# Patient Record
Sex: Female | Born: 1980 | Race: Black or African American | Hispanic: No | Marital: Single | Smoking: Current every day smoker
Health system: Southern US, Community
[De-identification: ages and names within clinical notes are randomized; demographics above are authoritative.]

## PROBLEM LIST (undated history)

## (undated) DIAGNOSIS — I10 Essential (primary) hypertension: Secondary | ICD-10-CM

## (undated) DIAGNOSIS — J45909 Unspecified asthma, uncomplicated: Secondary | ICD-10-CM

## (undated) DIAGNOSIS — D649 Anemia, unspecified: Secondary | ICD-10-CM

## (undated) DIAGNOSIS — G4730 Sleep apnea, unspecified: Secondary | ICD-10-CM

---

## 2011-06-23 ENCOUNTER — Emergency Department (HOSPITAL_COMMUNITY)
Admission: EM | Admit: 2011-06-23 | Discharge: 2011-06-23 | Disposition: A | Discharge status: Home / Self Care | Payer: Self-pay | Attending: Emergency Medicine | Admitting: Emergency Medicine

## 2011-06-23 DIAGNOSIS — Z0000 Encounter for general adult medical examination without abnormal findings: Secondary | ICD-10-CM | POA: Insufficient documentation

## 2011-06-23 DIAGNOSIS — R110 Nausea: Secondary | ICD-10-CM | POA: Insufficient documentation

## 2015-10-30 ENCOUNTER — Emergency Department (HOSPITAL_BASED_OUTPATIENT_CLINIC_OR_DEPARTMENT_OTHER)
Admission: EM | Admit: 2015-10-30 | Discharge: 2015-10-30 | Disposition: A | Discharge status: Home / Self Care | Payer: Medicaid Other | Attending: Emergency Medicine | Admitting: Emergency Medicine

## 2015-10-30 ENCOUNTER — Encounter (HOSPITAL_BASED_OUTPATIENT_CLINIC_OR_DEPARTMENT_OTHER): Payer: Self-pay | Admitting: *Deleted

## 2015-10-30 DIAGNOSIS — N390 Urinary tract infection, site not specified: Secondary | ICD-10-CM

## 2015-10-30 DIAGNOSIS — Z880 Allergy status to penicillin: Secondary | ICD-10-CM | POA: Insufficient documentation

## 2015-10-30 DIAGNOSIS — R319 Hematuria, unspecified: Secondary | ICD-10-CM | POA: Diagnosis present

## 2015-10-30 DIAGNOSIS — I10 Essential (primary) hypertension: Secondary | ICD-10-CM | POA: Diagnosis not present

## 2015-10-30 DIAGNOSIS — R030 Elevated blood-pressure reading, without diagnosis of hypertension: Secondary | ICD-10-CM | POA: Diagnosis not present

## 2015-10-30 DIAGNOSIS — Z3202 Encounter for pregnancy test, result negative: Secondary | ICD-10-CM | POA: Diagnosis not present

## 2015-10-30 DIAGNOSIS — F17210 Nicotine dependence, cigarettes, uncomplicated: Secondary | ICD-10-CM | POA: Insufficient documentation

## 2015-10-30 HISTORY — DX: Essential (primary) hypertension: I10

## 2015-10-30 LAB — URINE MICROSCOPIC-ADD ON

## 2015-10-30 LAB — URINALYSIS, ROUTINE W REFLEX MICROSCOPIC
Glucose, UA: NEGATIVE mg/dL
Ketones, ur: 15 mg/dL — AB
NITRITE: POSITIVE — AB
Protein, ur: 100 mg/dL — AB
SPECIFIC GRAVITY, URINE: 1.027 (ref 1.005–1.030)
pH: 6 (ref 5.0–8.0)

## 2015-10-30 LAB — PREGNANCY, URINE: PREG TEST UR: NEGATIVE

## 2015-10-30 MED ORDER — PHENAZOPYRIDINE HCL 100 MG PO TABS
100.0000 mg | ORAL_TABLET | Freq: Once | ORAL | Status: AC
  Administered 2015-10-30: 100 mg via ORAL
  Filled 2015-10-30: qty 1

## 2015-10-30 MED ORDER — CEPHALEXIN 500 MG PO CAPS: 500.0000 mg | ORAL_CAPSULE | Freq: Two times a day (BID) | ORAL | Status: DC

## 2015-10-30 MED ORDER — CEPHALEXIN 250 MG PO CAPS
500.0000 mg | ORAL_CAPSULE | Freq: Once | ORAL | Status: AC
  Administered 2015-10-30: 500 mg via ORAL
  Filled 2015-10-30: qty 2

## 2015-10-30 MED ORDER — PHENAZOPYRIDINE HCL 200 MG PO TABS: 200.0000 mg | ORAL_TABLET | Freq: Three times a day (TID) | ORAL | Status: DC

## 2015-10-30 MED FILL — CEPHALEXIN 500 MG CAPSULE: 500 | 10 days supply | Qty: 20 | Fill #0

## 2015-10-30 NOTE — Discharge Instructions (Signed)
Please follow with your primary care doctor in the next 5 days for high blood pressure evaluation. If you do not have a primary care doctor, present to urgent care. Reduce salt intake. Seek emergency medical care for unilateral weakness, slurring, change in vision, or chest pain and shortness of breath.  Take your antibiotics as directed and to completion. You should never have any leftover antibiotics! Push fluids and stay well hydrated.   Any antibiotic use can reduce the efficacy of hormonal birth control. Please use back up method of contraception.   Do not hesitate to return to the emergency room for any new, worsening or concerning symptoms.  Please obtain primary care using resource guide below. Let them know that you were seen in the emergency room and that they will need to obtain records for further outpatient management.   Urinary Tract Infection Urinary tract infections (UTIs) can develop anywhere along your urinary tract. Your urinary tract is your body's drainage system for removing wastes and extra water. Your urinary tract includes two kidneys, two ureters, a bladder, and a urethra. Your kidneys are a pair of bean-shaped organs. Each kidney is about the size of your fist. They are located below your ribs, one on each side of your spine. CAUSES Infections are caused by microbes, which are microscopic organisms, including fungi, viruses, and bacteria. These organisms are so small that they can only be seen through a microscope. Bacteria are the microbes that most commonly cause UTIs. SYMPTOMS  Symptoms of UTIs may vary by age and gender of the patient and by the location of the infection. Symptoms in young women typically include a frequent and intense urge to urinate and a painful, burning feeling in the bladder or urethra during urination. Older women and men are more likely to be tired, shaky, and weak and have muscle aches and abdominal pain. A fever may mean the infection is in  your kidneys. Other symptoms of a kidney infection include pain in your back or sides below the ribs, nausea, and vomiting. DIAGNOSIS To diagnose a UTI, your caregiver will ask you about your symptoms. Your caregiver will also ask you to provide a urine sample. The urine sample will be tested for bacteria and white blood cells. White blood cells are made by your body to help fight infection. TREATMENT  Typically, UTIs can be treated with medication. Because most UTIs are caused by a bacterial infection, they usually can be treated with the use of antibiotics. The choice of antibiotic and length of treatment depend on your symptoms and the type of bacteria causing your infection. HOME CARE INSTRUCTIONS  If you were prescribed antibiotics, take them exactly as your caregiver instructs you. Finish the medication even if you feel better after you have only taken some of the medication.  Drink enough water and fluids to keep your urine clear or pale yellow.  Avoid caffeine, tea, and carbonated beverages. They tend to irritate your bladder.  Empty your bladder often. Avoid holding urine for long periods of time.  Empty your bladder before and after sexual intercourse.  After a bowel movement, women should cleanse from front to back. Use each tissue only once. SEEK MEDICAL CARE IF:   You have back pain.  You develop a fever.  Your symptoms do not begin to resolve within 3 days. SEEK IMMEDIATE MEDICAL CARE IF:   You have severe back pain or lower abdominal pain.  You develop chills.  You have nausea or vomiting.  You  have continued burning or discomfort with urination. MAKE SURE YOU:   Understand these instructions.  Will watch your condition.  Will get help right away if you are not doing well or get worse.   This information is not intended to replace advice given to you by your health care provider. Make sure you discuss any questions you have with your health care provider.     Document Released: 07/24/2005 Document Revised: 07/05/2015 Document Reviewed: 11/22/2011 Elsevier Interactive Patient Education 2016 [employer].   Emergency Department Resource Guide 1) Find a Doctor and Pay Out of Pocket Although you won't have to find out who is covered by your insurance plan, it is a good idea to ask around and get recommendations. You will then need to call the office and see if the doctor you have chosen will accept you as a new patient and what types of options they offer for patients who are self-pay. Some doctors offer discounts or will set up payment plans for their patients who do not have insurance, but you will need to ask so you aren't surprised when you get to your appointment.  2) Contact Your Local Health Department Not all health departments have doctors that can see patients for sick visits, but many do, so it is worth a call to see if yours does. If you don't know where your local health department is, you can check in your phone book. The CDC also has a tool to help you locate your state's health department, and many state websites also have listings of all of their local health departments.  3) Find a Walk-in Clinic If your illness is not likely to be very severe or complicated, you may want to try a walk in clinic. These are popping up all over the country in pharmacies, drugstores, and shopping centers. They're usually staffed by nurse practitioners or physician assistants that have been trained to treat common illnesses and complaints. They're usually fairly quick and inexpensive. However, if you have serious medical issues or chronic medical problems, these are probably not your best option.  No Primary Care Doctor: - Call Health Connect at  [phone] - they can help you locate a primary care doctor that  accepts your insurance, provides certain services, etc. - Physician Referral Service- [phone]  Chronic Pain Problems: Organization          Address  Phone   Notes  [facility]  [phone] Patients need to be referred by their primary care doctor.   Medication Assistance: Organization         Address  Phone   Notes  [facility] Medication [facility] [address] [place] [place], [place] [address] [phone] --Must be a resident of [facility] -- Must have NO insurance coverage whatsoever (no Medicaid/ Medicare, etc.) -- The pt. MUST have a primary care doctor that directs their care regularly and follows them in the community   MedAssist  [phone]   [employer]  [phone]    Agencies that provide inexpensive medical care: Organization         Address  Phone   Notes  [facility]  [phone]   [facility]    [phone]   [facility] [address] [place], [place] [address] [phone]   [facility] [address] [place] [place] [phone]   Planned Parenthood    [phone]  [facility]    [phone]   [facility]  [address], [place] Phone:  [phone], Fax:  [phone]) [phone] Hours of Operation:  9 am - 6 pm, M-F.  Also accepts Medicaid/Medicare and self-pay.  [facility]  [address], [place] Phone: [phone], Fax: [phone]) [phone]. Hours of Operation:  8:30 am - 5:30 pm, M-F.  Also accepts Medicaid and self-pay.  [facility] [place] [address], [place] Phone: [phone]) [phone]   [facility] [address], [place], [place] [phone], Ext. [phone] Mondays & Thursdays: 7-9 AM.  First 15 patients are seen on a first come, first serve basis.    Medicaid-accepting [facility] Providers:  Organization         Address  Phone   Notes  [facility] [address], [place] [phone]) [phone] Also accepts self-pay patients.  [facility] [address], [place]  [phone]) [phone]   [facility] [address], [place] [phone]) [phone]   [facility] [address] [place] [address], [place] [phone]) [phone]   Renaye RakersVeita Bland [address], [place]   [phone]) [phone] Only accepts [place] Access [place] patients after they have their name applied to their card.   Self-Pay (no insurance) in [facility]:  Organization         Address  Phone   Notes  Sickle Cell Patients, [facility] [address], [place] [phone]) [phone]   [facility] [address], [place] [phone]) [phone]   [facility]  [address], [place] [phone](336) [phone]   [facility]/Dr. Osei-Bonsu  [address] [place] [address], [place] or [phone], [place] [phone]) [phone] Phone number for both [place] and [place] locations is the same.  [facility] [address], [place] [phone]) [phone]   [facility] [place] [address] [place] [address], [place] or [address] [phone]) [phone]   [facility] [address], [place] [phone](336) [phone], phone; [phone](336) [phone], fax Sees patients 1st and 3rd Saturday of every month.  Must not qualify for public or private insurance (i.e. Medicaid, Medicare, [place] Health Choice, Veterans' Benefits)  Household income should be no more than 200% of the poverty level The clinic cannot treat you if you are pregnant or think you are pregnant  Sexually transmitted diseases are not treated at the clinic.    Dental Care: Organization         Address  Phone  Notes  [facility] of [facility] [address], [place] [phone]) [phone] Accepts children up to age 34 who are enrolled in [place] or [place] Health Choice; pregnant women with a Medicaid card; and  children who have applied for Medicaid or [place] Health Choice, but were declined, whose parents can pay a reduced fee at time of service.  [facility] of [facility] [place]  [address], [place] [phone]) [phone] Accepts children up to age 34 who are enrolled in [place] or [place] Health Choice; pregnant women with a Medicaid card; and children who have applied for Medicaid or [place] Health Choice, but were declined, whose parents can pay a reduced fee at time  of service.  [facility]  [address], [place] [phone]) [phone] Patients are seen by appointment only. Walk-ins are not accepted. [facility] will see patients 34 years of age and older. Monday - Tuesday (8am-5pm) Most Wednesdays (8:30-5pm) $30 per visit, cash only  [facility]  [address], [facility] [phone] Patients are seen by appointment only. Walk-ins are not accepted. [facility] will see patients 34 years of age and older. One Wednesday Evening (Monthly: Volunteer Based).  $30 per visit, cash only  [employer] [facility] [facility]  [phone] for adults; Children under age 234, call Graduate Pediatric Dentistry at [phone]. Children aged 464-14, please call [phone] to request a pediatric application.  Dental services are provided in all areas of dental care including fillings, crowns and bridges, complete and partial dentures, implants, gum treatment, root canals, and extractions. Preventive care is also provided. Treatment is provided to both adults and children. Patients are selected via a lottery and there is often a waiting list.   [facility] [address], [place]  [phone] [email or web address]   [facility] [address], [place], [place] [phone], Ext. [phone] Second and Fourth Thursday of each month, opens at 6:30 AM; Clinic ends at 9 AM.  Patients are seen on a first-come first-served  basis, and a limited number are seen during each clinic.   [facility]  [address], [place], [place] [phone]) [phone]   Eligibility Requirements You must have lived in [place], [place], or [place] counties for at least the last three months.   You cannot be eligible for state or federal sponsored National Cityhealthcare insurance, including CIGNAVeterans Administration, [place]icaid, or [employer]Medicare.   You generally cannot be eligible for healthcare insurance through your employer.    How to apply: Eligibility screenings are held every Tuesday and Wednesday afternoon from 1:00 pm until 4:00 pm. You do not need an appointment for the interview!  [facility] [address], [place], [place] [phone]   [facility]  [phone]   [facility]  [phone]   [facility]  [phone]    Behavioral Health Resources in the Community: Intensive Outpatient Programs Organization         Address  Phone  Notes  [facility] [address], [place], [place] [phone]   [facility] [address], [place], [place] [phone]   [facility] [address], [place], [place]  [phone]   [facility] [address],  [place], [place] [phone] or [phone]   Substance Abuse Resources Organization         Address  Phone  Notes  [facility]  [phone]   [facility]  [phone]   [facility]  [phone]   [facility]  [phone]   Residential & Outpatient Substance Abuse Program  [phone]   Psychological Services Organization         Address  Phone  Notes  [facility]  [phone]- [phone]   [facility]  [phone]- [phone]   [facility] [address], [place] [phone] or [phone]    Mobile Crisis Teams Organization          Address  Phone  Notes  [facility], Mobile Crisis Care Unit  [phone]   [facility]ive Psychotherapeutic  Services  [address] [place], [place] [phone]   [facility] [address] [place] [phone]    Self-Help/Support Groups Organization         Address  Phone             Notes  [facility]ental Health Assoc[facility] of [place] - variety of support groups  [phone]- [phone] Call for more information  Narcotics Anonymous (NA), [facility] [address], [employer] [place]  2 meetings at this location   Statistician         Address  Phone  Notes  [facility] [address],    [place]  [phone]   [facility]  [address] [place] [address], [place], [place] [phone]   [facility] [address] [place], [place] [phone] Admissions: 8am-3pm M-F  [facility] [address]    [place], [place] [phone]   [facility] [address] [place], [place], [place] [phone]   [facility] [address],  [place], [place] [phone]   [facility] - Intensive Outpatient [address], [place], [place] [phone]   [facility] (Addiction [facility] [address]  [place], [place] [phone] or [phone]   [facility] (RTS) [address], [place], [place] [phone] Accepts Medicaid  [facility] [place] [address],  [place] [phone] Substance Abuse/Addiction Treatment   [facility] Organization         Address  Phone  Notes  [facility]  [phone]   Angie Fava, PhD [address] [place], [place]   [phone] or [phone]   [facility]   [address] [place], [place] [phone]   [facility] [address] [place], [place] [phone] Insurance/Medicaid/sponsorship  through [facility] and Families [address]                                    [place], [place] [phone] Therapy/tele-psych/case  [facility] [address], [place] [phone]    Dr. Lolly Mustache  [phone]   [facility]  [employer] [facility] Dept. 1) [address], [place] 2) [address] [place] 3)  [address], [place] [phone]  [phone]   [facility] Child Abuse Hotline 903-213-4031 or [phone] (After Hours)

## 2015-10-30 NOTE — ED Provider Notes (Signed)
CSN: [id number]     Arrival date & time 10/30/15  1206 History   First MD Initiated Contact with Patient 10/30/15 1216     Chief Complaint  Patient presents with  . Hematuria     (Consider location/radiation/quality/duration/timing/severity/associated sxs/prior Treatment) HPI   Blood pressure 168/92, pulse 98, temperature 98.1 F (36.7 C), temperature source Oral, resp. rate 20, height 5\' 11"  (1.803 m), weight 179.398 kg, last menstrual period 10/09/2015, SpO2 95 %.  Morgan Bryant is a 35 y.o. female complaining of dysuria, hematuria, urinary sediment onset this morning at 4 AM. Patient also has urinary frequency and sensation of incomplete voiding. She denies abnormal vaginal discharge, fever, chills, nausea, vomiting, flank pain. Patient states that she holds her urine.   Past Medical History  Diagnosis Date  . Hypertension    Past Surgical History  Procedure Laterality Date  . Cesarean section     No family history on file. Social History  Substance Use Topics  . Smoking status: Current Every Day Smoker -- 0.50 packs/day    Types: Cigarettes  . Smokeless tobacco: None  . Alcohol Use: No   OB History    No data available     Review of Systems  10 systems reviewed and found to be negative, except as noted in the HPI.   Allergies  Penicillins  Home Medications   Prior to Admission medications   Medication Sig Start Date End Date Taking? Authorizing Provider  cephALEXin (KEFLEX) 500 MG capsule Take 1 capsule (500 mg total) by mouth 2 (two) times daily. 10/30/15   Geisha Abernathy, PA-C  phenazopyridine (PYRIDIUM) 200 MG tablet Take 1 tablet (200 mg total) by mouth 3 (three) times daily. 10/30/15   Rilynne Lonsway, PA-C   BP 168/92 mmHg  Pulse 98  Temp(Src) 98.1 F (36.7 C) (Oral)  Resp 20  Ht 5\' 11"  (1.803 m)  Wt 179.398 kg  BMI 55.19 kg/m2  SpO2 95%  LMP 10/09/2015 Physical Exam  Constitutional: She is oriented to person, place, and time. She appears  well-developed and well-nourished. No distress.  HENT:  Head: Normocephalic.  Mouth/Throat: Oropharynx is clear and moist.  Eyes: Conjunctivae and EOM are normal.  Neck: Normal range of motion.  Cardiovascular: Normal rate.   Pulmonary/Chest: Effort normal. No stridor.  Abdominal: Soft. Bowel sounds are normal. She exhibits no distension and no mass. There is tenderness. There is no rebound and no guarding.  Mild suprapubic tenderness to palpation with no guarding or rebound  Genitourinary:  No CVA tenderness to percussion bilaterally  Musculoskeletal: Normal range of motion.  Neurological: She is alert and oriented to person, place, and time.  Psychiatric: She has a normal mood and affect.  Nursing note and vitals reviewed.   ED Course  Procedures (including critical care time) Labs Review Labs Reviewed  URINALYSIS, ROUTINE W REFLEX MICROSCOPIC (NOT AT [facility]) - Abnormal; Notable for the following:    Color, Urine AMBER (*)    APPearance CLOUDY (*)    Hgb urine dipstick LARGE (*)    Bilirubin Urine SMALL (*)    Ketones, ur 15 (*)    Protein, ur 100 (*)    Nitrite POSITIVE (*)    Leukocytes, UA SMALL (*)    All other components within normal limits  URINE MICROSCOPIC-ADD ON - Abnormal; Notable for the following:    Squamous Epithelial / LPF 0-5 (*)    Bacteria, UA MANY (*)    All other components within normal limits  URINE CULTURE  PREGNANCY, URINE    Imaging Review No results found. I have personally reviewed and evaluated these images and lab results as part of my medical decision-making.   EKG Interpretation None      MDM   Final diagnoses:  UTI (lower urinary tract infection)  Elevated blood pressure reading    Filed Vitals:   10/30/15 1214  BP: 168/92  Pulse: 98  Temp: 98.1 F (36.7 C)  TempSrc: Oral  Resp: 20  Height: 5\' 11"  (1.803 m)  Weight: 179.398 kg  SpO2: 95%    Medications  phenazopyridine (PYRIDIUM) tablet 100 mg (not administered)   cephALEXin (KEFLEX) capsule 500 mg (not administered)    Morgan Bryant is 35 y.o. female presenting with dysuria, hematuria, urinary frequency, sensation of incomplete void onset this a.m. Patient afebrile, no CVA tenderness to percussion. Tolerating by mouth's. Her blood pressure is elevated, has history of hypertension, advised patient she will need to follow with PCP for a recheck. Counseled on smoking cessation.  UA consistent with infection, culture ordered and pending. Patient will be started on Keflex and Pyridium.  Evaluation does not show pathology that would require ongoing emergent intervention or inpatient treatment. Pt is hemodynamically stable and mentating appropriately. Discussed findings and plan with patient/guardian, who agrees with care plan. All questions answered. Return precautions discussed and outpatient follow up given.   New Prescriptions   CEPHALEXIN (KEFLEX) 500 MG CAPSULE    Take 1 capsule (500 mg total) by mouth 2 (two) times daily.   PHENAZOPYRIDINE (PYRIDIUM) 200 MG TABLET    Take 1 tablet (200 mg total) by mouth 3 (three) times daily.         Wynetta Emery, PA-C 10/30/15 1245  Rolan Bucco, MD 10/30/15 1451

## 2015-10-30 NOTE — ED Notes (Signed)
Hematuria and dysuria woke her at 4am.

## 2015-11-01 LAB — URINE CULTURE: Culture: >=100000

## 2015-11-02 ENCOUNTER — Telehealth (HOSPITAL_COMMUNITY): Payer: Self-pay

## 2015-11-02 NOTE — Telephone Encounter (Signed)
Post ED Visit - Positive Culture Follow-up  Culture report reviewed by antimicrobial stewardship pharmacist:  []  Enzo BiNathan Batchelder, Pharm.D. []  Celedonio MiyamotoJeremy Frens, Pharm.D., BCPS []  Garvin FilaMike Maccia, Pharm.D. []  Georgina PillionElizabeth Martin, Pharm.D., BCPS []  CampobelloMinh Pham, [address]Pharm[address]D., BCPS, AAHIVP []  Estella HuskMichelle Turner, Pharm.D., BCPS, AAHIVP [x]  Tennis Mustassie Stewart, Pharm.D. []  Sherle Poeob Vincent, [address]Pharm[address]  Positive urine culture, >/= 100,000 colonies -> E Coli Treated with Cephalexin, organism sensitive to the same and no further patient follow-up is required at this time.  Arvid RightClark, Avonna Iribe Dorn 11/02/2015, 12:36 PM

## 2016-04-08 ENCOUNTER — Emergency Department (HOSPITAL_BASED_OUTPATIENT_CLINIC_OR_DEPARTMENT_OTHER)
Admission: EM | Admit: 2016-04-08 | Discharge: 2016-04-08 | Disposition: A | Discharge status: Home / Self Care | Payer: Medicaid Other | Attending: Emergency Medicine | Admitting: Emergency Medicine

## 2016-04-08 ENCOUNTER — Encounter (HOSPITAL_BASED_OUTPATIENT_CLINIC_OR_DEPARTMENT_OTHER): Payer: Self-pay | Admitting: Emergency Medicine

## 2016-04-08 ENCOUNTER — Emergency Department (HOSPITAL_BASED_OUTPATIENT_CLINIC_OR_DEPARTMENT_OTHER): Payer: Medicaid Other

## 2016-04-08 DIAGNOSIS — Z7951 Long term (current) use of inhaled steroids: Secondary | ICD-10-CM | POA: Diagnosis not present

## 2016-04-08 DIAGNOSIS — J45901 Unspecified asthma with (acute) exacerbation: Secondary | ICD-10-CM | POA: Diagnosis not present

## 2016-04-08 DIAGNOSIS — R509 Fever, unspecified: Secondary | ICD-10-CM | POA: Insufficient documentation

## 2016-04-08 DIAGNOSIS — R55 Syncope and collapse: Secondary | ICD-10-CM | POA: Diagnosis not present

## 2016-04-08 DIAGNOSIS — F17210 Nicotine dependence, cigarettes, uncomplicated: Secondary | ICD-10-CM | POA: Insufficient documentation

## 2016-04-08 DIAGNOSIS — I10 Essential (primary) hypertension: Secondary | ICD-10-CM | POA: Insufficient documentation

## 2016-04-08 DIAGNOSIS — M549 Dorsalgia, unspecified: Secondary | ICD-10-CM | POA: Insufficient documentation

## 2016-04-08 DIAGNOSIS — R0602 Shortness of breath: Secondary | ICD-10-CM | POA: Diagnosis present

## 2016-04-08 DIAGNOSIS — Z79899 Other long term (current) drug therapy: Secondary | ICD-10-CM | POA: Insufficient documentation

## 2016-04-08 HISTORY — DX: Unspecified asthma, uncomplicated: J45.909

## 2016-04-08 MED ORDER — IPRATROPIUM-ALBUTEROL 0.5-2.5 (3) MG/3ML IN SOLN
3.0000 mL | Freq: Once | RESPIRATORY_TRACT | Status: AC
  Administered 2016-04-08: 3 mL via RESPIRATORY_TRACT

## 2016-04-08 MED ORDER — ALBUTEROL SULFATE (2.5 MG/3ML) 0.083% IN NEBU
INHALATION_SOLUTION | RESPIRATORY_TRACT | Status: AC
  Administered 2016-04-08: 2.5 mg via RESPIRATORY_TRACT
  Filled 2016-04-08: qty 3

## 2016-04-08 MED ORDER — IPRATROPIUM-ALBUTEROL 0.5-2.5 (3) MG/3ML IN SOLN
RESPIRATORY_TRACT | Status: AC
  Administered 2016-04-08: 3 mL via RESPIRATORY_TRACT
  Filled 2016-04-08: qty 3

## 2016-04-08 MED ORDER — ALBUTEROL SULFATE (2.5 MG/3ML) 0.083% IN NEBU
2.5000 mg | INHALATION_SOLUTION | Freq: Once | RESPIRATORY_TRACT | Status: AC
  Administered 2016-04-08: 2.5 mg via RESPIRATORY_TRACT

## 2016-04-08 MED ORDER — ALBUTEROL SULFATE (2.5 MG/3ML) 0.083% IN NEBU: 2.5000 mg | INHALATION_SOLUTION | Freq: Four times a day (QID) | RESPIRATORY_TRACT | Status: DC | PRN

## 2016-04-08 MED ORDER — PREDNISONE 10 MG PO TABS: 40.0000 mg | ORAL_TABLET | Freq: Every day | ORAL | Status: AC

## 2016-04-08 MED FILL — ALBUTEROL 0.083% INHAL SOLN: (2.5 MG/3ML | 7 days supply | Qty: 75 | Fill #0

## 2016-04-08 NOTE — ED Notes (Signed)
Patient transported to X-ray 

## 2016-04-08 NOTE — ED Notes (Signed)
Nurse first-pt c/o sob r/t to asthma- O2 sat 99 HR 108 RR 20-pt NAD-states she left [facility] ED just prior to coming here

## 2016-04-08 NOTE — ED Notes (Signed)
Patient comes in because the patient had "asthma attack" The patient reports that EMS gave her 2 treatments and she is still SOB

## 2016-04-08 NOTE — ED Provider Notes (Signed)
CSN: [id number]     Arrival date & time 04/08/16  1450 History  By signing my name below, I, Soijett Blue, attest that this documentation has been prepared under the direction and in the presence of Alvira MondayErin Mariel Gaudin, MD. Electronically Signed: Soijett Blue, ED Scribe. 04/08/2016. 3:19 PM.   Chief Complaint  Patient presents with  . Shortness of Breath      The history is provided by the patient. No language interpreter was used.    Morgan SchmidKenyatta Strider is a 35 y.o. female with a medical hx of asthma and HTN who presents to the Emergency Department complaining of SOB onset last night worsening this morning. Pt typically uses her albuterol inhaler and neb treatment for exacerbation of her asthma. Pt states that she has been admitted to the hospital for her asthma and placed in the ICU unit due to her asthma. Pt was at [facility] but LWBS following triage, pt was transported to [facility] by EMS and given 2 breathing treatments en route along with steroids. Pt is having associated symptoms of productive cough x yellow-white sputum, wheezing, feeling hot and cold, rhinorrhea, sore throat, mild CP due to asthma, and mid back pain worsened with breathing and palpation. Denies sick contacts or seasonal allergies. She notes that she has tried albuterol inhaler with no relief of her symptoms. She denies abdominal pain, leg pain/swelling, and any other symptoms. Pt denies recent travel, immobilization, surgery, estrogen use, birth control use, or PMHx of blood clots. Pt has had a tubal ligation.  Reports this asthma exacerbation is not as severe as others.   Past Medical History  Diagnosis Date  . Hypertension   . Asthma    Past Surgical History  Procedure Laterality Date  . Cesarean section     History reviewed. No pertinent family history. Social History  Substance Use Topics  . Smoking status: Current Every Day Smoker -- 0.50 packs/day    Types: Cigarettes  . Smokeless tobacco: None   . Alcohol Use: No   OB History    No data available     Review of Systems  Constitutional: Positive for fever (subjective feeling "hot and cold") and chills (feeling hot and cold).  HENT: Positive for rhinorrhea and sore throat.   Eyes: Negative for visual disturbance.  Respiratory: Positive for cough (productive), shortness of breath and wheezing.   Cardiovascular: Positive for chest pain (due to asthma ). Negative for leg swelling.  Gastrointestinal: Negative for nausea, vomiting and abdominal pain.  Genitourinary: Negative for difficulty urinating.  Musculoskeletal: Positive for back pain. Negative for arthralgias and neck pain.  Skin: Negative for rash.  Neurological: Positive for syncope. Negative for headaches.      Allergies  Penicillins  Home Medications   Prior to Admission medications   Medication Sig Start Date End Date Taking? Authorizing Provider  albuterol (PROVENTIL HFA;VENTOLIN HFA) 108 (90 Base) MCG/ACT inhaler Inhale 2 puffs into the lungs every 6 (six) hours as needed for wheezing or shortness of breath.   Yes Historical Provider, MD  lisinopril (PRINIVIL,ZESTRIL) 20 MG tablet Take 20 mg by mouth daily.   Yes Historical Provider, MD  albuterol (PROVENTIL) (2.5 MG/3ML) 0.083% nebulizer solution Take 3 mLs (2.5 mg total) by nebulization every 6 (six) hours as needed for wheezing or shortness of breath. 04/08/16   Alvira MondayErin Jasmon Graffam, MD  cephALEXin (KEFLEX) 500 MG capsule Take 1 capsule (500 mg total) by mouth 2 (two) times daily. 10/30/15   Wynetta EmeryNicole Pisciotta, PA-C  phenazopyridine (PYRIDIUM) 200 MG tablet Take 1 tablet (200 mg total) by mouth 3 (three) times daily. 10/30/15   Nicole Pisciotta, PA-C  predniSONE (DELTASONE) 10 MG tablet Take 4 tablets (40 mg total) by mouth daily. 04/08/16 04/12/16  Alvira Monday, MD   BP 156/92 mmHg  Pulse 101  Temp(Src) 98.5 F (36.9 C) (Oral)  Resp 18  Ht  (1.803 m)  Wt 375 lb (170.099 kg)  BMI 52.33 kg/m2  SpO2 100%  LMP  03/28/2016 Physical Exam  Constitutional: She is oriented to person, place, and time. She appears well-developed and well-nourished. No distress.  HENT:  Head: Normocephalic and atraumatic.  Eyes: EOM are normal.  Neck: Neck supple.  Cardiovascular: Normal rate, regular rhythm, normal heart sounds and intact distal pulses.  Exam reveals no gallop and no friction rub.   No murmur heard. Pulmonary/Chest: Effort normal. No respiratory distress. She has wheezes. She has no rales. She exhibits tenderness (back tenderness on right).  End expiratory wheezing  Abdominal: Soft. She exhibits no distension. There is no guarding.  Musculoskeletal: Normal range of motion.  Neurological: She is alert and oriented to person, place, and time.  Skin: Skin is warm and dry.  Psychiatric: She has a normal mood and affect. Her behavior is normal.  Nursing note and vitals reviewed.   ED Course  Procedures (including critical care time) DIAGNOSTIC STUDIES: Oxygen Saturation is 97% on RA, nl by my interpretation.    COORDINATION OF CARE: 3:17 PM Discussed treatment plan with pt at bedside which includes breathing treatment, EKG, and CXR and pt agreed to plan.    Labs Review Labs Reviewed - No data to display  Imaging Review Dg Chest 2 View  04/08/2016  CLINICAL DATA:  Cough and shortness of breath EXAM: CHEST  2 VIEW COMPARISON:  None. FINDINGS: Lungs are clear. Heart size and pulmonary vascularity are normal. No adenopathy. There is slight upper thoracic levoscoliosis. IMPRESSION: No edema or consolidation. Electronically Signed   By: Bretta Bang III M.D.   On: 04/08/2016 16:05   I have personally reviewed and evaluated these images as part of my medical decision-making.   EKG Interpretation   Date/Time:  Monday April 08 2016 15:38:09 EDT Ventricular Rate:  111 PR Interval:  150 QRS Duration: 88 QT Interval:  338 QTC Calculation: 459 R Axis:   55 Text Interpretation:  Sinus tachycardia  Otherwise normal ECG No previous  ECGs available Confirmed by [facility] MD, Genevive Printup ([id number]) on 04/08/2016  3:44:29 PM      MDM   Final diagnoses:  Asthma exacerbation   35 year old female with a history of hypertension and asthma presents with concern of dyspnea.  Patient with a history of asthma, reports of these symptoms are the same as prior asthma exacerbations. Reports some chest discomfort which she has with her asthma exacerbations, and have low suspicion for ACS. Have low suspicion for pulmonary embolus, given patient with normal oxygen saturation, no other risk factors, and wheezing on exam consistent with asthma exacerbations. Pt with back tenderness likely musculoskeletal. X-ray was obtained to evaluate for signs of pneumonia and showed no acute abnormalities. EKG shows sinus tachycardia, likely secondary to albuterol. Patient had received IV Solu-Medrol/albuterol with EMS in route to [facility]. She is given DuoNeb's in the emergency department with improvement of her symptoms. Patient likely with asthma exacerbation secondary to URI. She is also out of her albuterol nebulizer medication. Patient was given a prescription for epidural nebulizer, as well  as 4 days of prednisone. Patient discharged in stable condition with understanding of reasons to return.   I personally performed the services described in this documentation, which was scribed in my presence. The recorded information has been reviewed and is accurate.    Alvira Monday, MD 04/08/16 [phone]

## 2016-04-08 NOTE — Discharge Instructions (Signed)
Asthma, Adult Asthma is a recurring condition in which the airways tighten and narrow. Asthma can make it difficult to breathe. It can cause coughing, wheezing, and shortness of breath. Asthma episodes, also called asthma attacks, range from minor to life-threatening. Asthma cannot be cured, but medicines and lifestyle changes can help control it. CAUSES Asthma is believed to be caused by inherited (genetic) and environmental factors, but its exact cause is unknown. Asthma may be triggered by allergens, lung infections, or irritants in the air. Asthma triggers are different for each person. Common triggers include:   Animal dander.  Dust mites.  Cockroaches.  Pollen from trees or grass.  Mold.  Smoke.  Air pollutants such as dust, household cleaners, hair sprays, aerosol sprays, paint fumes, strong chemicals, or strong odors.  Cold air, weather changes, and winds (which increase molds and pollens in the air).  Strong emotional expressions such as crying or laughing hard.  Stress.  Certain medicines (such as aspirin) or types of drugs (such as beta-blockers).  Sulfites in foods and drinks. Foods and drinks that may contain sulfites include dried fruit, potato chips, and sparkling grape juice.  Infections or inflammatory conditions such as the flu, a cold, or an inflammation of the nasal membranes (rhinitis).  Gastroesophageal reflux disease (GERD).  Exercise or strenuous activity. SYMPTOMS Symptoms may occur immediately after asthma is triggered or many hours later. Symptoms include:  Wheezing.  Excessive nighttime or early morning coughing.  Frequent or severe coughing with a common cold.  Chest tightness.  Shortness of breath. DIAGNOSIS  The diagnosis of asthma is made by a review of your medical history and a physical exam. Tests may also be performed. These may include:  Lung function studies. These tests show how much air you breathe in and out.  Allergy  tests.  Imaging tests such as X-rays. TREATMENT  Asthma cannot be cured, but it can usually be controlled. Treatment involves identifying and avoiding your asthma triggers. It also involves medicines. There are 2 classes of medicine used for asthma treatment:   Controller medicines. These prevent asthma symptoms from occurring. They are usually taken every day.  Reliever or rescue medicines. These quickly relieve asthma symptoms. They are used as needed and provide short-term relief. Your health care provider will help you create an asthma action plan. An asthma action plan is a written plan for managing and treating your asthma attacks. It includes a list of your asthma triggers and how they may be avoided. It also includes information on when medicines should be taken and when their dosage should be changed. An action plan may also involve the use of a device called a peak flow meter. A peak flow meter measures how well the lungs are working. It helps you monitor your condition. HOME CARE INSTRUCTIONS   Take medicines only as directed by your health care provider. Speak with your health care provider if you have questions about how or when to take the medicines.  Use a peak flow meter as directed by your health care provider. Record and keep track of readings.  Understand and use the action plan to help minimize or stop an asthma attack without needing to seek medical care.  Control your home environment in the following ways to help prevent asthma attacks:  Do not smoke. Avoid being exposed to secondhand smoke.  Change your heating and air conditioning filter regularly.  Limit your use of fireplaces and wood stoves.  Get rid of pests (such as roaches   and mice) and their droppings.  Throw away plants if you see mold on them.  Clean your floors and dust regularly. Use unscented cleaning products.  Try to have someone else vacuum for you regularly. Stay out of rooms while they are  being vacuumed and for a short while afterward. If you vacuum, use a dust mask from a hardware store, a double-layered or microfilter vacuum cleaner bag, or a vacuum cleaner with a HEPA filter.  Replace carpet with wood, tile, or vinyl flooring. Carpet can trap dander and dust.  Use allergy-proof pillows, mattress covers, and box spring covers.  Wash bed sheets and blankets every week in hot water and dry them in a dryer.  Use blankets that are made of polyester or cotton.  Clean bathrooms and kitchens with bleach. If possible, have someone repaint the walls in these rooms with mold-resistant paint. Keep out of the rooms that are being cleaned and painted.  Wash hands frequently. SEEK MEDICAL CARE IF:   You have wheezing, shortness of breath, or a cough even if taking medicine to prevent attacks.  The colored mucus you cough up (sputum) is thicker than usual.  Your sputum changes from clear or white to yellow, green, gray, or bloody.  You have any problems that may be related to the medicines you are taking (such as a rash, itching, swelling, or trouble breathing).  You are using a reliever medicine more than 2-3 times per week.  Your peak flow is still at 50-79% of your personal best after following your action plan for 1 hour.  You have a fever. SEEK IMMEDIATE MEDICAL CARE IF:   You seem to be getting worse and are unresponsive to treatment during an asthma attack.  You are short of breath even at rest.  You get short of breath when doing very little physical activity.  You have difficulty eating, drinking, or talking due to asthma symptoms.  You develop chest pain.  You develop a fast heartbeat.  You have a bluish color to your lips or fingernails.  You are light-headed, dizzy, or faint.  Your peak flow is less than 50% of your personal best.   This information is not intended to replace advice given to you by your health care provider. Make sure you discuss any  questions you have with your health care provider.   Document Released: 10/14/2005 Document Revised: 07/05/2015 Document Reviewed: 05/13/2013 Elsevier Interactive Patient Education 2016 [employer]  

## 2017-06-19 ENCOUNTER — Encounter (HOSPITAL_BASED_OUTPATIENT_CLINIC_OR_DEPARTMENT_OTHER): Payer: Self-pay

## 2017-06-19 ENCOUNTER — Inpatient Hospital Stay (HOSPITAL_BASED_OUTPATIENT_CLINIC_OR_DEPARTMENT_OTHER)
Admission: EM | Admit: 2017-06-19 | Discharge: 2017-06-24 | DRG: 202 | Disposition: A | Discharge status: Home / Self Care | Payer: Self-pay | Attending: Internal Medicine | Admitting: Internal Medicine

## 2017-06-19 ENCOUNTER — Emergency Department (HOSPITAL_BASED_OUTPATIENT_CLINIC_OR_DEPARTMENT_OTHER)
Admission: EM | Admit: 2017-06-19 | Discharge: 2017-06-19 | Disposition: A | Discharge status: Home / Self Care | Payer: Medicaid Other | Attending: Emergency Medicine | Admitting: Emergency Medicine

## 2017-06-19 ENCOUNTER — Encounter (HOSPITAL_BASED_OUTPATIENT_CLINIC_OR_DEPARTMENT_OTHER): Payer: Self-pay | Admitting: *Deleted

## 2017-06-19 DIAGNOSIS — T380X5A Adverse effect of glucocorticoids and synthetic analogues, initial encounter: Secondary | ICD-10-CM | POA: Diagnosis present

## 2017-06-19 DIAGNOSIS — Z6841 Body Mass Index (BMI) 40.0 and over, adult: Secondary | ICD-10-CM

## 2017-06-19 DIAGNOSIS — Z880 Allergy status to penicillin: Secondary | ICD-10-CM

## 2017-06-19 DIAGNOSIS — I10 Essential (primary) hypertension: Secondary | ICD-10-CM | POA: Diagnosis present

## 2017-06-19 DIAGNOSIS — Z79899 Other long term (current) drug therapy: Secondary | ICD-10-CM

## 2017-06-19 DIAGNOSIS — B349 Viral infection, unspecified: Secondary | ICD-10-CM

## 2017-06-19 DIAGNOSIS — J9801 Acute bronchospasm: Secondary | ICD-10-CM | POA: Insufficient documentation

## 2017-06-19 DIAGNOSIS — F17210 Nicotine dependence, cigarettes, uncomplicated: Secondary | ICD-10-CM | POA: Insufficient documentation

## 2017-06-19 DIAGNOSIS — J4521 Mild intermittent asthma with (acute) exacerbation: Secondary | ICD-10-CM

## 2017-06-19 DIAGNOSIS — R112 Nausea with vomiting, unspecified: Secondary | ICD-10-CM

## 2017-06-19 DIAGNOSIS — D72829 Elevated white blood cell count, unspecified: Secondary | ICD-10-CM | POA: Diagnosis present

## 2017-06-19 DIAGNOSIS — J45901 Unspecified asthma with (acute) exacerbation: Principal | ICD-10-CM | POA: Diagnosis present

## 2017-06-19 DIAGNOSIS — G4733 Obstructive sleep apnea (adult) (pediatric): Secondary | ICD-10-CM | POA: Diagnosis present

## 2017-06-19 DIAGNOSIS — D509 Iron deficiency anemia, unspecified: Secondary | ICD-10-CM | POA: Diagnosis present

## 2017-06-19 HISTORY — DX: Anemia, unspecified: D64.9

## 2017-06-19 HISTORY — DX: Morbid (severe) obesity due to excess calories: E66.01

## 2017-06-19 HISTORY — DX: Sleep apnea, unspecified: G47.30

## 2017-06-19 MED ORDER — ALBUTEROL SULFATE (2.5 MG/3ML) 0.083% IN NEBU
INHALATION_SOLUTION | RESPIRATORY_TRACT | Status: AC
  Administered 2017-06-19: 2.5 mg
  Filled 2017-06-19: qty 3

## 2017-06-19 MED ORDER — AZITHROMYCIN 500 MG IV SOLR
500.0000 mg | Freq: Once | INTRAVENOUS | Status: AC
  Administered 2017-06-20: 500 mg via INTRAVENOUS

## 2017-06-19 MED ORDER — ALBUTEROL SULFATE (2.5 MG/3ML) 0.083% IN NEBU
INHALATION_SOLUTION | RESPIRATORY_TRACT | Status: AC
  Administered 2017-06-20: 5 mg via RESPIRATORY_TRACT
  Filled 2017-06-19: qty 6

## 2017-06-19 MED ORDER — ONDANSETRON 8 MG PO TBDP
8.0000 mg | ORAL_TABLET | Freq: Once | ORAL | Status: AC
  Administered 2017-06-19: 8 mg via ORAL
  Filled 2017-06-19: qty 1

## 2017-06-19 MED ORDER — IPRATROPIUM BROMIDE 0.02 % IN SOLN
RESPIRATORY_TRACT | Status: AC
  Administered 2017-06-20: 0.5 mg via RESPIRATORY_TRACT
  Filled 2017-06-19: qty 2.5

## 2017-06-19 MED ORDER — IPRATROPIUM BROMIDE 0.02 % IN SOLN
0.5000 mg | Freq: Once | RESPIRATORY_TRACT | Status: AC
  Administered 2017-06-20: 0.5 mg via RESPIRATORY_TRACT

## 2017-06-19 MED ORDER — ALBUTEROL SULFATE HFA 108 (90 BASE) MCG/ACT IN AERS
2.0000 | INHALATION_SPRAY | Freq: Once | RESPIRATORY_TRACT | Status: AC
  Administered 2017-06-19: 2 via RESPIRATORY_TRACT
  Filled 2017-06-19: qty 6.7

## 2017-06-19 MED ORDER — ACETAMINOPHEN 500 MG PO TABS
1000.0000 mg | ORAL_TABLET | Freq: Once | ORAL | Status: AC
  Administered 2017-06-20: 1000 mg via ORAL
  Filled 2017-06-19: qty 2

## 2017-06-19 MED ORDER — ALBUTEROL SULFATE (2.5 MG/3ML) 0.083% IN NEBU
5.0000 mg | INHALATION_SOLUTION | Freq: Once | RESPIRATORY_TRACT | Status: AC
Start: 2017-06-20 — End: 2017-06-20
  Administered 2017-06-20: 5 mg via RESPIRATORY_TRACT

## 2017-06-19 MED ORDER — ONDANSETRON 8 MG PO TBDP: 8.0000 mg | ORAL_TABLET | Freq: Three times a day (TID) | ORAL | 0 refills | Status: DC | PRN

## 2017-06-19 NOTE — ED Notes (Signed)
ED Provider at bedside. 

## 2017-06-19 NOTE — ED Triage Notes (Signed)
Asthma. States she was seen last night for same. Expiratory wheeze noted. Speaking in complete sentences. Cough noted.

## 2017-06-19 NOTE — ED Provider Notes (Signed)
[facility] Provider Note: Lowella Dell, MD, FACEP  CSN: [id number] MRN: [id number] ARRIVAL: 06/19/17 at 0247 ROOM: [facility]10/[facility]10   CHIEF COMPLAINT  Vomiting   HISTORY OF PRESENT ILLNESS  06/19/17 5:03 AM Morgan Bryant is a 36 y.o. female with a history of asthma. She developed some shortness of breath and wheezing earlier this morning which was not relieved with her home nebulizer machine. Her nebulizer machine may be malfunctioning. Her daughter became sick with nausea and vomiting and she brought her to the emergency department this morning. On the way to the emergency department she began having nausea and vomiting herself. There was no associated diarrhea but there was some mild abdominal cramping. She was given Zofran 8 milligrams ODT with relief of her nausea and she has been drinking fluids without emesis since. On arrival she was noted to have wheezing and she was given an albuterol inhaler and instruction in that she has. With 4 puffs of albuterol her breathing significantly improved and she is feeling much better. She has not had a fever.   Past Medical History:  Diagnosis Date  . Anemia   . Asthma   . Hypertension   . Sleep apnea     Past Surgical History:  Procedure Laterality Date  . CESAREAN SECTION      No family history on file.  Social History  Substance Use Topics  . Smoking status: Current Every Day Smoker    Packs/day: 0.50    Types: Cigarettes  . Smokeless tobacco: Not on file  . Alcohol use No    Prior to Admission medications   Medication Sig Start Date End Date Taking? Authorizing Provider  albuterol (PROVENTIL HFA;VENTOLIN HFA) 108 (90 Base) MCG/ACT inhaler Inhale 2 puffs into the lungs every 6 (six) hours as needed for wheezing or shortness of breath.    [provider]  albuterol (PROVENTIL) (2.5 MG/3ML) 0.083% nebulizer solution Take 3 mLs (2.5 mg total) by nebulization every 6 (six) hours as needed for wheezing or  shortness of breath. 04/08/16   Alvira Monday, MD  cephALEXin (KEFLEX) 500 MG capsule Take 1 capsule (500 mg total) by mouth 2 (two) times daily. 10/30/15   Pisciotta, Joni Reining, PA-C  lisinopril (PRINIVIL,ZESTRIL) 20 MG tablet Take 20 mg by mouth daily.    [provider]  phenazopyridine (PYRIDIUM) 200 MG tablet Take 1 tablet (200 mg total) by mouth 3 (three) times daily. 10/30/15   Pisciotta, Joni Reining, PA-C    Allergies Penicillins   REVIEW OF SYSTEMS  Negative except as noted here or in the History of Present Illness.   PHYSICAL EXAMINATION  Initial Vital Signs Blood pressure (!) 190/92, pulse (!) 103, temperature 98.3 F (36.8 C), temperature source Oral, resp. rate 18, height 5\' 10"  (1.778 m), weight (!) 172.4 kg (380 lb), last menstrual period 05/26/2017, SpO2 97 %.  Examination General: Well-developed, obese female in no acute distress; appearance consistent with age of record HENT: normocephalic; atraumatic Eyes: Normal appearance Neck: supple Heart: regular rate and rhythm Lungs: clear to auscultation bilaterally Abdomen: soft; nondistended; nontender; bowel sounds present Extremities: No deformity; full range of motion; pulses normal Neurologic: Awake, alert and oriented; motor function intact in all extremities and symmetric; no facial droop Skin: Warm and dry Psychiatric: Normal mood and affect   RESULTS  Summary of this visit's results, reviewed by myself:   EKG Interpretation  Date/Time:    Ventricular Rate:    PR Interval:    QRS Duration:   QT  Interval:    QTC Calculation:   R Axis:     Text Interpretation:        Laboratory Studies: No results found for this or any previous visit (from the past 24 hour(s)). Imaging Studies: No results found.  ED COURSE  Nursing notes and initial vitals signs, including pulse oximetry, reviewed.  Vitals:   06/19/17 0304 06/19/17 0343  BP: (!) 190/92   Pulse: (!) 103   Resp: 18   Temp: 98.3 F (36.8 C)    TempSrc: Oral   SpO2: 99% 97%  Weight: (!) 172.4 kg (380 lb)   Height: 5\' 10"  (1.778 m)     PROCEDURES    ED DIAGNOSES     ICD-10-CM   1. Nausea and vomiting in adult R11.2   2. Acute bronchospasm J98.01        Brentin Shin, Jonny Ruiz, MD 06/19/17 [phone]

## 2017-06-19 NOTE — ED Notes (Addendum)
Mom feeling better after inhaler treatment and she and daughter are sitting comfortably waiting for provider.  Mom given ice for her drink and advised that she can sip slowly on PO fluids.

## 2017-06-19 NOTE — ED Notes (Signed)
Pt verbalizes understanding of d/c instructions and denies any further needs at this time. 

## 2017-06-19 NOTE — ED Notes (Signed)
Asked mom if she and daughter ate the same thing for dinner, she replied that they had dominoes for dinner and then chili cheese dip at 2200.   Checked on mom to be sure she stopped vomiting and she states "I need a breathing treatment."  RT informed of patient's request.

## 2017-06-19 NOTE — ED Provider Notes (Signed)
[facility] Provider Note   CSN: [id number] Arrival date & time: 06/19/17  2320     History   Chief Complaint Chief Complaint  Patient presents with  . Asthma    HPI Morgan Bryant is a 36 y.o. female.  The history is provided by the patient.  Asthma  This is a recurrent problem. The current episode started yesterday. The problem occurs constantly. The problem has not changed since onset.Pertinent negatives include no chest pain, no abdominal pain and no headaches. Nothing aggravates the symptoms. Nothing relieves the symptoms. Treatments tried: albuterol. The treatment provided no relief.  Patient was seen earlier in the day for wheezing and developed n/v/d.  Daughter was seen for same.  Went home but asthma symptoms were no better.  No CP, no leg pain or swelling no productive cough no abdominal pain.  No sore throat.    Past Medical History:  Diagnosis Date  . Anemia   . Asthma   . Hypertension   . Sleep apnea     There are no active problems to display for this patient.   Past Surgical History:  Procedure Laterality Date  . CESAREAN SECTION      OB History    No data available       Home Medications    Prior to Admission medications   Medication Sig Start Date End Date Taking? Authorizing Provider  albuterol (PROVENTIL) (2.5 MG/3ML) 0.083% nebulizer solution Take 3 mLs (2.5 mg total) by nebulization every 6 (six) hours as needed for wheezing or shortness of breath. 04/08/16   Alvira Monday, MD  lisinopril (PRINIVIL,ZESTRIL) 20 MG tablet Take 20 mg by mouth daily.    [provider]  ondansetron (ZOFRAN ODT) 8 MG disintegrating tablet Take 1 tablet (8 mg total) by mouth every 8 (eight) hours as needed for nausea or vomiting. 06/19/17   Molpus, Jonny Ruiz, MD    Family History History reviewed. No pertinent family history.  Social History Social History  Substance Use Topics  . Smoking status: Current Every Day Smoker    Packs/day: 0.50      Types: Cigarettes  . Smokeless tobacco: Not on file  . Alcohol use No     Allergies   Penicillins   Review of Systems Review of Systems  Constitutional: Negative for diaphoresis.  HENT: Positive for congestion. Negative for drooling, sore throat, tinnitus, trouble swallowing and voice change.   Respiratory: Positive for cough and wheezing. Negative for stridor.   Cardiovascular: Negative for chest pain, palpitations and leg swelling.  Gastrointestinal: Negative for abdominal pain.  Neurological: Negative for headaches.  All other systems reviewed and are negative.    Physical Exam Updated Vital Signs BP (!) 129/96 (BP Location: Right Wrist)   Pulse (!) 109   Temp (!) 100.4 F (38 C) (Oral)   Resp (!) 28   Ht 5\' 10"  (1.778 m)   Wt (!) 172.4 kg (380 lb)   LMP 05/26/2017   SpO2 96%   BMI 54.52 kg/m   Physical Exam  Constitutional: She is oriented to person, place, and time. She appears well-developed and well-nourished. No distress.  HENT:  Head: Normocephalic and atraumatic.  Mouth/Throat: No oropharyngeal exudate.  Eyes: Pupils are equal, round, and reactive to light. Conjunctivae are normal.  Neck: Normal range of motion. Neck supple.  Cardiovascular: Normal rate, regular rhythm, normal heart sounds and intact distal pulses.   Pulmonary/Chest: Effort normal. No stridor. She has wheezes.  Abdominal: Soft. Bowel sounds are  normal. She exhibits no mass. There is no tenderness. There is no guarding.  Musculoskeletal: Normal range of motion.  Neurological: She is alert and oriented to person, place, and time. She displays normal reflexes.  Skin: Skin is warm and dry. Capillary refill takes less than 2 seconds.  Psychiatric: She has a normal mood and affect.     ED Treatments / Results  Labs (all labs ordered are listed, but only abnormal results are displayed)  Results for orders placed or performed during the hospital encounter of 06/19/17  CBC with  Differential/Platelet  Result Value Ref Range   WBC 18.0 (H) 4.0 - 10.5 K/uL   RBC 4.48 3.87 - 5.11 MIL/uL   Hemoglobin 10.4 (L) 12.0 - 15.0 g/dL   HCT 74.2 (L) 59.5 - 63.8 %   MCV 74.1 (L) 78.0 - 100.0 fL   MCH 23.2 (L) 26.0 - 34.0 pg   MCHC 31.3 30.0 - 36.0 g/dL   RDW 75.6 (H) 43.3 - 29.5 %   Platelets 237 150 - 400 K/uL   Neutrophils Relative % 67 %   Neutro Abs 12.1 (H) 1.7 - 7.7 K/uL   Lymphocytes Relative 26 %   Lymphs Abs 4.6 (H) 0.7 - 4.0 K/uL   Monocytes Relative 5 %   Monocytes Absolute 1.0 0.1 - 1.0 K/uL   Eosinophils Relative 2 %   Eosinophils Absolute 0.4 0.0 - 0.7 K/uL   Basophils Relative 0 %   Basophils Absolute 0.0 0.0 - 0.1 K/uL   WBC Morphology WHITE COUNT CONFIRMED ON SMEAR    RBC Morphology TEARDROP CELLS    Smear Review PLATELET COUNT CONFIRMED BY SMEAR   Basic metabolic panel  Result Value Ref Range   Sodium 137 135 - 145 mmol/L   Potassium 3.7 3.5 - 5.1 mmol/L   Chloride 105 101 - 111 mmol/L   CO2 24 22 - 32 mmol/L   Glucose, Bld 121 (H) 65 - 99 mg/dL   BUN 8 6 - 20 mg/dL   Creatinine, Ser 1.88 0.44 - 1.00 mg/dL   Calcium 8.6 (L) 8.9 - 10.3 mg/dL   GFR calc non Af Amer >60 >60 mL/min   GFR calc Af Amer >60 >60 mL/min   Anion gap 8 5 - 15  Pregnancy, urine  Result Value Ref Range   Preg Test, Ur NEGATIVE NEGATIVE  Urinalysis, Routine w reflex microscopic  Result Value Ref Range   Color, Urine YELLOW YELLOW   APPearance CLEAR CLEAR   Specific Gravity, Urine 1.023 1.005 - 1.030   pH 6.0 5.0 - 8.0   Glucose, UA NEGATIVE NEGATIVE mg/dL   Hgb urine dipstick NEGATIVE NEGATIVE   Bilirubin Urine NEGATIVE NEGATIVE   Ketones, ur NEGATIVE NEGATIVE mg/dL   Protein, ur NEGATIVE NEGATIVE mg/dL   Nitrite NEGATIVE NEGATIVE   Leukocytes, UA NEGATIVE NEGATIVE   Dg Chest 2 View  Result Date: 06/20/2017 CLINICAL DATA:  Shortness of breath, wheezing, cough EXAM: CHEST  2 VIEW COMPARISON:  04/08/2016 FINDINGS: Lungs are essentially clear. No focal  consolidation. No pleural effusion or pneumothorax. The heart is normal in size. Visualized osseous structures are within normal limits. IMPRESSION: No evidence of acute cardiopulmonary disease. Electronically Signed   By: Charline Bills M.D.   On: 06/20/2017 01:19     Procedures Procedures (including critical care time)  Medications Ordered in ED  Medications  albuterol (PROVENTIL) (2.5 MG/3ML) 0.083% nebulizer solution 10 mg (10 mg Nebulization Not Given 06/20/17 0134)  magnesium sulfate IVPB 1  g 100 mL (1 g Intravenous New Bag/Given 06/20/17 0147)  0.9 %  sodium chloride infusion ( Intravenous New Bag/Given 06/20/17 0148)  albuterol (PROVENTIL) (2.5 MG/3ML) 0.083% nebulizer solution (2.5 mg  Given 06/19/17 2330)  acetaminophen (TYLENOL) tablet 1,000 mg (1,000 mg Oral Given 06/20/17 0017)  albuterol (PROVENTIL) (2.5 MG/3ML) 0.083% nebulizer solution 5 mg (5 mg Nebulization Given 06/20/17 0000)  ipratropium (ATROVENT) nebulizer solution 0.5 mg (0.5 mg Nebulization Given 06/20/17 0000)  azithromycin (ZITHROMAX) 500 mg in dextrose 5 % 250 mL IVPB (0 mg Intravenous Stopped 06/20/17 0125)  azithromycin (ZITHROMAX) 500 MG injection (500 mg  Given 06/20/17 0017)  albuterol (PROVENTIL, VENTOLIN) (5 MG/ML) 0.5% continuous inhalation solution (  Given 06/20/17 0124)     Final Clinical Impressions(s) / ED Diagnoses  Asthma exacerbation: desaturates with ambulation.  Will admit to medicine   Tamikka Pilger, MD 06/20/17 1610

## 2017-06-19 NOTE — ED Triage Notes (Signed)
Pt here bc she states she needs a breathing treatment and is out of her home asthma supplies.  She is also c/o nausea and one episode of vomiting, which her daughter is here for the same thing.  Pt speaking in full sentences, no acute distress, fine expiratory wheeze throughout.

## 2017-06-19 NOTE — ED Notes (Signed)
As nurse was getting pt nausea medication, she drank the soda that she stopped to get on the way to the ED and vomited it up.  Allowed pt to rinse vomit taste out of mouth and gave her the nausea medication.  Advised pt and daughter to hold off on drinking anything else.

## 2017-06-20 ENCOUNTER — Emergency Department (HOSPITAL_BASED_OUTPATIENT_CLINIC_OR_DEPARTMENT_OTHER): Payer: Self-pay

## 2017-06-20 ENCOUNTER — Encounter (HOSPITAL_BASED_OUTPATIENT_CLINIC_OR_DEPARTMENT_OTHER): Payer: Self-pay | Admitting: Emergency Medicine

## 2017-06-20 DIAGNOSIS — I10 Essential (primary) hypertension: Secondary | ICD-10-CM | POA: Diagnosis present

## 2017-06-20 DIAGNOSIS — J45901 Unspecified asthma with (acute) exacerbation: Secondary | ICD-10-CM | POA: Diagnosis present

## 2017-06-20 DIAGNOSIS — G4733 Obstructive sleep apnea (adult) (pediatric): Secondary | ICD-10-CM | POA: Diagnosis present

## 2017-06-20 LAB — PREGNANCY, URINE: PREG TEST UR: NEGATIVE

## 2017-06-20 LAB — BASIC METABOLIC PANEL
ANION GAP: 8 (ref 5–15)
BUN: 8 mg/dL (ref 6–20)
CHLORIDE: 105 mmol/L (ref 101–111)
CO2: 24 mmol/L (ref 22–32)
Calcium: 8.6 mg/dL — ABNORMAL LOW (ref 8.9–10.3)
Creatinine, Ser: 0.91 mg/dL (ref 0.44–1.00)
GFR calc Af Amer: >60 mL/min (ref >60)
Glucose, Bld: 121 mg/dL — ABNORMAL HIGH (ref 65–99)
POTASSIUM: 3.7 mmol/L (ref 3.5–5.1)
SODIUM: 137 mmol/L (ref 135–145)

## 2017-06-20 LAB — I-STAT CG4 LACTIC ACID, ED: Lactic Acid, Venous: 2.03 mmol/L (ref 0.5–1.9)

## 2017-06-20 LAB — CBC WITH DIFFERENTIAL/PLATELET
Basophils Absolute: 0 10*3/uL (ref 0.0–0.1)
Basophils Relative: 0 %
Eosinophils Absolute: 0.4 10*3/uL (ref 0.0–0.7)
Eosinophils Relative: 2 %
HCT: 33.2 % — ABNORMAL LOW (ref 36.0–46.0)
HEMOGLOBIN: 10.4 g/dL — AB (ref 12.0–15.0)
LYMPHS ABS: 4.6 10*3/uL — AB (ref 0.7–4.0)
Lymphocytes Relative: 26 %
MCH: 23.2 pg — AB (ref 26.0–34.0)
MCHC: 31.3 g/dL (ref 30.0–36.0)
MCV: 74.1 fL — ABNORMAL LOW (ref 78.0–100.0)
MONO ABS: 1 10*3/uL (ref 0.1–1.0)
MONOS PCT: 5 %
NEUTROS ABS: 12.1 10*3/uL — AB (ref 1.7–7.7)
NEUTROS PCT: 67 %
PLATELETS: 237 10*3/uL (ref 150–400)
RBC: 4.48 MIL/uL (ref 3.87–5.11)
RDW: 18.7 % — ABNORMAL HIGH (ref 11.5–15.5)
WBC: 18 10*3/uL — ABNORMAL HIGH (ref 4.0–10.5)

## 2017-06-20 LAB — URINALYSIS, ROUTINE W REFLEX MICROSCOPIC
BILIRUBIN URINE: NEGATIVE
GLUCOSE, UA: NEGATIVE mg/dL
HGB URINE DIPSTICK: NEGATIVE
Ketones, ur: NEGATIVE mg/dL
Leukocytes, UA: NEGATIVE
Nitrite: NEGATIVE
Protein, ur: NEGATIVE mg/dL
Specific Gravity, Urine: 1.023 (ref 1.005–1.030)
pH: 6 (ref 5.0–8.0)

## 2017-06-20 LAB — LACTIC ACID, PLASMA: LACTIC ACID, VENOUS: 1.4 mmol/L (ref 0.5–1.9)

## 2017-06-20 LAB — TSH: TSH: 0.201 u[IU]/mL — AB (ref 0.350–4.500)

## 2017-06-20 MED ORDER — IPRATROPIUM BROMIDE 0.02 % IN SOLN
0.5000 mg | RESPIRATORY_TRACT | Status: DC
  Administered 2017-06-20: 0.5 mg via RESPIRATORY_TRACT
  Filled 2017-06-20: qty 2.5

## 2017-06-20 MED ORDER — LEVALBUTEROL HCL 1.25 MG/0.5ML IN NEBU
1.2500 mg | INHALATION_SOLUTION | Freq: Four times a day (QID) | RESPIRATORY_TRACT | Status: DC | PRN
  Administered 2017-06-20 – 2017-06-21 (×2): 1.25 mg via RESPIRATORY_TRACT
  Filled 2017-06-20 (×2): qty 0.5

## 2017-06-20 MED ORDER — ONDANSETRON HCL 4 MG/2ML IJ SOLN: 4.0000 mg | Freq: Four times a day (QID) | INTRAMUSCULAR | Status: DC | PRN

## 2017-06-20 MED ORDER — HYDRALAZINE HCL 20 MG/ML IJ SOLN
10.0000 mg | Freq: Four times a day (QID) | INTRAMUSCULAR | Status: DC | PRN
  Filled 2017-06-20: qty 0.5

## 2017-06-20 MED ORDER — MAGNESIUM SULFATE IN D5W 1-5 GM/100ML-% IV SOLN
1.0000 g | Freq: Once | INTRAVENOUS | Status: AC
  Administered 2017-06-20: 1 g via INTRAVENOUS
  Filled 2017-06-20: qty 100

## 2017-06-20 MED ORDER — ALBUTEROL (5 MG/ML) CONTINUOUS INHALATION SOLN
INHALATION_SOLUTION | RESPIRATORY_TRACT | Status: AC
  Administered 2017-06-20: 01:00:00
  Filled 2017-06-20: qty 20

## 2017-06-20 MED ORDER — BISACODYL 5 MG PO TBEC
5.0000 mg | DELAYED_RELEASE_TABLET | Freq: Every day | ORAL | Status: DC | PRN
  Administered 2017-06-21: 5 mg via ORAL
  Filled 2017-06-20: qty 1

## 2017-06-20 MED ORDER — ALBUTEROL SULFATE (2.5 MG/3ML) 0.083% IN NEBU
5.0000 mg | INHALATION_SOLUTION | Freq: Once | RESPIRATORY_TRACT | Status: AC
  Administered 2017-06-20: 5 mg via RESPIRATORY_TRACT
  Filled 2017-06-20: qty 6

## 2017-06-20 MED ORDER — HEPARIN SODIUM (PORCINE) 5000 UNIT/ML IJ SOLN
5000.0000 [IU] | Freq: Three times a day (TID) | INTRAMUSCULAR | Status: DC
  Administered 2017-06-20 – 2017-06-24 (×11): 5000 [IU] via SUBCUTANEOUS
  Filled 2017-06-20 (×10): qty 1

## 2017-06-20 MED ORDER — AMLODIPINE BESYLATE 5 MG PO TABS
5.0000 mg | ORAL_TABLET | Freq: Every day | ORAL | Status: DC
  Administered 2017-06-21 – 2017-06-24 (×4): 5 mg via ORAL
  Filled 2017-06-20 (×4): qty 1

## 2017-06-20 MED ORDER — LEVALBUTEROL HCL 0.63 MG/3ML IN NEBU
0.6300 mg | INHALATION_SOLUTION | RESPIRATORY_TRACT | Status: DC
Start: 2017-06-20 — End: 2017-06-20

## 2017-06-20 MED ORDER — NICOTINE 14 MG/24HR TD PT24
14.0000 mg | MEDICATED_PATCH | Freq: Every day | TRANSDERMAL | Status: DC
  Administered 2017-06-20 – 2017-06-24 (×5): 14 mg via TRANSDERMAL
  Filled 2017-06-20 (×5): qty 1

## 2017-06-20 MED ORDER — ACETAMINOPHEN 650 MG RE SUPP: 650.0000 mg | Freq: Four times a day (QID) | RECTAL | Status: DC | PRN

## 2017-06-20 MED ORDER — DM-GUAIFENESIN ER 30-600 MG PO TB12
1.0000 | ORAL_TABLET | Freq: Two times a day (BID) | ORAL | Status: DC
  Filled 2017-06-20: qty 1

## 2017-06-20 MED ORDER — BUDESONIDE 0.5 MG/2ML IN SUSP
0.5000 mg | Freq: Two times a day (BID) | RESPIRATORY_TRACT | Status: DC
  Administered 2017-06-20 – 2017-06-24 (×8): 0.5 mg via RESPIRATORY_TRACT
  Filled 2017-06-20 (×9): qty 2

## 2017-06-20 MED ORDER — ALBUTEROL SULFATE (2.5 MG/3ML) 0.083% IN NEBU
INHALATION_SOLUTION | RESPIRATORY_TRACT | Status: AC
  Administered 2017-06-20: 2.5 mg
  Filled 2017-06-20: qty 3

## 2017-06-20 MED ORDER — ORAL CARE MOUTH RINSE
15.0000 mL | Freq: Two times a day (BID) | OROMUCOSAL | Status: DC
  Administered 2017-06-20 – 2017-06-23 (×6): 15 mL via OROMUCOSAL

## 2017-06-20 MED ORDER — SODIUM CHLORIDE 0.9 % IV BOLUS (SEPSIS)
2000.0000 mL | Freq: Once | INTRAVENOUS | Status: AC
  Administered 2017-06-20 (×2): 2000 mL via INTRAVENOUS

## 2017-06-20 MED ORDER — LEVALBUTEROL HCL 1.25 MG/0.5ML IN NEBU: 1.2500 mg | INHALATION_SOLUTION | RESPIRATORY_TRACT | Status: DC

## 2017-06-20 MED ORDER — IPRATROPIUM BROMIDE 0.02 % IN SOLN
0.5000 mg | Freq: Four times a day (QID) | RESPIRATORY_TRACT | Status: DC
  Administered 2017-06-21 – 2017-06-24 (×14): 0.5 mg via RESPIRATORY_TRACT
  Filled 2017-06-20 (×15): qty 2.5

## 2017-06-20 MED ORDER — ACETAMINOPHEN 325 MG PO TABS
650.0000 mg | ORAL_TABLET | Freq: Four times a day (QID) | ORAL | Status: DC | PRN
  Administered 2017-06-20 – 2017-06-22 (×3): 650 mg via ORAL
  Filled 2017-06-20 (×3): qty 2

## 2017-06-20 MED ORDER — DM-GUAIFENESIN ER 30-600 MG PO TB12
1.0000 | ORAL_TABLET | Freq: Two times a day (BID) | ORAL | Status: DC | PRN
  Administered 2017-06-20 – 2017-06-23 (×5): 1 via ORAL
  Filled 2017-06-20 (×5): qty 1

## 2017-06-20 MED ORDER — AZITHROMYCIN 250 MG PO TABS
500.0000 mg | ORAL_TABLET | Freq: Every day | ORAL | Status: DC
  Administered 2017-06-21 – 2017-06-24 (×4): 500 mg via ORAL
  Filled 2017-06-20 (×3): qty 2

## 2017-06-20 MED ORDER — OXYCODONE HCL 5 MG PO TABS
5.0000 mg | ORAL_TABLET | ORAL | Status: DC | PRN
  Administered 2017-06-20 – 2017-06-23 (×5): 5 mg via ORAL
  Filled 2017-06-20 (×5): qty 1

## 2017-06-20 MED ORDER — ONDANSETRON HCL 4 MG PO TABS: 4.0000 mg | ORAL_TABLET | Freq: Four times a day (QID) | ORAL | Status: DC | PRN

## 2017-06-20 MED ORDER — LEVALBUTEROL HCL 1.25 MG/0.5ML IN NEBU
1.2500 mg | INHALATION_SOLUTION | Freq: Four times a day (QID) | RESPIRATORY_TRACT | Status: DC
  Administered 2017-06-21 – 2017-06-24 (×14): 1.25 mg via RESPIRATORY_TRACT
  Filled 2017-06-20 (×15): qty 0.5

## 2017-06-20 MED ORDER — AZITHROMYCIN 500 MG IV SOLR
INTRAVENOUS | Status: AC
  Administered 2017-06-20: 500 mg
  Filled 2017-06-20: qty 500

## 2017-06-20 MED ORDER — METHYLPREDNISOLONE SODIUM SUCC 125 MG IJ SOLR
125.0000 mg | Freq: Once | INTRAMUSCULAR | Status: AC
  Administered 2017-06-20: 125 mg via INTRAVENOUS
  Filled 2017-06-20: qty 2

## 2017-06-20 MED ORDER — ALBUTEROL SULFATE (2.5 MG/3ML) 0.083% IN NEBU: 10.0000 mg | INHALATION_SOLUTION | Freq: Once | RESPIRATORY_TRACT | Status: DC

## 2017-06-20 MED ORDER — METHYLPREDNISOLONE SODIUM SUCC 125 MG IJ SOLR
80.0000 mg | Freq: Four times a day (QID) | INTRAMUSCULAR | Status: DC
  Administered 2017-06-20 – 2017-06-23 (×11): 80 mg via INTRAVENOUS
  Filled 2017-06-20 (×11): qty 2

## 2017-06-20 MED ORDER — SODIUM CHLORIDE 0.9 % IV SOLN
INTRAVENOUS | Status: DC
  Administered 2017-06-20 – 2017-06-21 (×3): via INTRAVENOUS

## 2017-06-20 MED ORDER — IPRATROPIUM BROMIDE 0.02 % IN SOLN
0.5000 mg | RESPIRATORY_TRACT | Status: DC
  Administered 2017-06-20 (×3): 0.5 mg via RESPIRATORY_TRACT
  Filled 2017-06-20 (×3): qty 2.5

## 2017-06-20 MED ORDER — LEVALBUTEROL TARTRATE 45 MCG/ACT IN AERO
2.0000 | INHALATION_SPRAY | Freq: Four times a day (QID) | RESPIRATORY_TRACT | Status: DC | PRN
  Filled 2017-06-20: qty 15

## 2017-06-20 MED ORDER — SODIUM CHLORIDE 0.9 % IV SOLN
INTRAVENOUS | Status: DC
  Administered 2017-06-20: 02:00:00 via INTRAVENOUS
  Administered 2017-06-20: 1000 mL via INTRAVENOUS

## 2017-06-20 NOTE — ED Notes (Signed)
Patient is resting comfortably, dozes off at intervels

## 2017-06-20 NOTE — ED Notes (Signed)
Pt ambulatory to bathroom, pt became tachycardic and tachypneic with oxygen sat 90%. MD made aware.

## 2017-06-20 NOTE — ED Notes (Signed)
Patient transported to X-ray via WC. 

## 2017-06-20 NOTE — ED Notes (Signed)
Up to BR in w/c, SOB on exertion

## 2017-06-20 NOTE — Progress Notes (Signed)
This is a no charge note  Transfer from [facility] per Dr. Nicanor Alcon  36 year old lady with past medical history of asthma, hypertension, OSA, anemia, tobacco abuse, who presents with cough, shortness of breath, wheezing, nausea, vomiting, diarrhea. Her daughter also has nausea, vomiting and diarrhea per ED physician.  Patient was found to have WBC 18.0, negative pregnancy test, negative urinalysis, electrolytes renal function okay, temperature 100.4, tachycardia, tachypnea, oxygen saturation 89% on room air, chest x-rays negative. Clinically consistent with asthma exacerbation. Patient is admitted to telemetry bed as inpatient.  Please call manager of [facility] at [phone] when pt arrives to floor   Lorretta Harp, MD  [facility] Pager [phone]  If 7PM-7AM, please contact night-coverage [email or web address] Password [email or web address] 06/20/2017, 3:59 AM

## 2017-06-20 NOTE — ED Notes (Signed)
Lactic Acid results 2.03 reported to Joretta Bachelor, Charity fundraiser.

## 2017-06-20 NOTE — H&P (Signed)
[facility] History and Physical  Starlina Lapre WJX:[id number] DOB: [birth year] DOA: 06/19/2017   PCP: Amelia Jo, FNP  Specialists: None  Chief Complaint: Shortness of breath, cough  HPI: Morgan Bryant is a 36 y.o. female with a past medical history of asthma, hypertension, obesity, recently diagnosed sleep apnea who hasn't started using CPAP yet. She was in her usual state of health till Wednesday night when she started having a cough and was coughing up clear expectoration. Subsequently she developed shortness of breath. Her daughter was sick at the same time with nausea, vomiting. They both went to the emergency department. She was treated in the ED with nebulizer treatments. She felt better. She was discharged with inhalers. It does not look like she was prescribed any steroids at that time. Over the course of the day, patient continued to use the inhaler but her symptoms continued to get worse. Subsequently, her sputum color changed to yellow. She had fever of 100.25F. She had chills. She usually has nebulizer treatments at home, but she had ran out of this. She also used to take Flovent but hasn't been taking this medication in a while. Since symptoms continued to get worse, she decided to come back to the emergency department. She states that the. After breathing treatments in the emergency room, she has been feeling slightly better, but gets really short of breath even with minimal exertion.  In the emergency department, patient was noted to be tachypneic. She was wheezing. She was given breathing treatments without much improvement. She was thought to require hospitalization for further management of her asthma exacerbation.  Home Medications: Prior to Admission medications   Medication Sig Start Date End Date Taking? Authorizing Provider  albuterol (PROVENTIL) (2.5 MG/3ML) 0.083% nebulizer solution Take 3 mLs (2.5 mg total) by nebulization every 6 (six) hours as needed for  wheezing or shortness of breath. 04/08/16  Yes Alvira Monday, MD  amLODipine (NORVASC) 5 MG tablet Take 5 mg by mouth daily. 03/19/17 03/19/18 Yes [provider]  lisinopril (PRINIVIL,ZESTRIL) 20 MG tablet Take 20 mg by mouth daily.   Yes [provider]  ondansetron (ZOFRAN ODT) 8 MG disintegrating tablet Take 1 tablet (8 mg total) by mouth every 8 (eight) hours as needed for nausea or vomiting. Patient not taking: Reported on 06/20/2017 06/19/17   Molpus, Jonny Ruiz, MD    Allergies:  Allergies  Allergen Reactions  . Penicillins     Hives Has patient had a PCN reaction causing immediate rash, facial/tongue/throat swelling, SOB or lightheadedness with hypotension:yesHas patient had a PCN reaction causing severe rash involving mucus membranes or skin necrosis:no Has patient had a PCN reaction that required hospitalization: yes Has patient had a PCN reaction occurring within the last 10 years:no If all of the above answers are "NO", then may proceed with Cephalosporin use.     Past Medical History: Past Medical History:  Diagnosis Date  . Anemia   . Asthma   . Hypertension   . Morbid obesity (HCC)   . Sleep apnea     Past Surgical History:  Procedure Laterality Date  . CESAREAN SECTION      Social History: Patient lives with her family. Smokes 8-10 cigarettes per day. No alcohol use. No illicit drug use. Works as a Surveyor, mining. Goes back to work on Monday.   Family History:  Family History  Problem Relation Age of Onset  . Hypertension Mother   . Cancer Father      Review of  Systems - History obtained from the patient General ROS: positive for  - fatigue Psychological ROS: negative Ophthalmic ROS: negative ENT ROS: negative Allergy and Immunology ROS: negative Hematological and Lymphatic ROS: negative Endocrine ROS: negative Respiratory ROS: as in hpi Cardiovascular ROS: Chest pain mainly with coughing Gastrointestinal ROS: Some abdominal pain  mainly with coughing Genito-Urinary ROS: no dysuria, trouble voiding, or hematuria Musculoskeletal ROS: negative Neurological ROS: no TIA or stroke symptoms Dermatological ROS: negative  Physical Examination  Vitals:   06/20/17 1517 06/20/17 1520 06/20/17 1600 06/20/17 1728  BP:  (!) 142/88 124/71 (!) 142/82  Pulse:  (!) 117 (!) 109 (!) 101  Resp:  (!) 23 (!) 23 (!) 22  Temp:    98.1 F (36.7 C)  TempSrc:    Oral  SpO2: 97% 97% 91% 97%  Weight:      Height:        BP (!) 142/82 (BP Location: Right Wrist)   Pulse (!) 101   Temp 98.1 F (36.7 C) (Oral)   Resp (!) 22   Ht 5\' 10"  (1.778 m)   Wt (!) 172.4 kg (380 lb)   LMP 05/26/2017   SpO2 97%   BMI 54.52 kg/m   General appearance: alert, cooperative, appears stated age, no distress and morbidly obese Head: Normocephalic, without obvious abnormality, atraumatic Eyes: conjunctivae/corneas clear. PERRL, EOM's intact.  Throat: lips, mucosa, and tongue normal; teeth and gums normal Neck: no adenopathy, no carotid bruit, no JVD, supple, symmetrical, trachea midline and thyroid not enlarged, symmetric, no tenderness/mass/nodules Back: symmetric, no curvature. ROM normal. No CVA tenderness. Resp: Noted to be tachypneic. No use of accessory muscles.. End expiratory wheezes heard bilaterally. No definite crackles. Few rhonchi. Cardio: regular rate and rhythm, S1, S2 normal, no murmur, click, rub or gallop GI: soft, non-tender; bowel sounds normal; no masses,  no organomegaly Extremities: extremities normal, atraumatic, no cyanosis or edema Pulses: 2+ and symmetric Skin: Skin color, texture, turgor normal. No rashes or lesions Lymph nodes: Cervical, supraclavicular, and axillary nodes normal. Neurologic: Awake, alert. Oriented 3. No focal neurological deficits.    Labs on Admission: I have personally reviewed following labs and imaging studies  CBC:  Recent Labs Lab 06/20/17 0000  WBC 18.0*  NEUTROABS 12.1*  HGB 10.4*    HCT 33.2*  MCV 74.1*  PLT 237   Basic Metabolic Panel:  Recent Labs Lab 06/20/17 0000  NA 137  K 3.7  CL 105  CO2 24  GLUCOSE 121*  BUN 8  CREATININE 0.91  CALCIUM 8.6*   GFR: Estimated Creatinine Clearance: 150 mL/min (by C-G formula based on SCr of 0.91 mg/dL).   Radiological Exams on Admission: Dg Chest 2 View  Result Date: 06/20/2017 CLINICAL DATA:  Shortness of breath, wheezing, cough EXAM: CHEST  2 VIEW COMPARISON:  04/08/2016 FINDINGS: Lungs are essentially clear. No focal consolidation. No pleural effusion or pneumothorax. The heart is normal in size. Visualized osseous structures are within normal limits. IMPRESSION: No evidence of acute cardiopulmonary disease. Electronically Signed   By: Charline Bills M.D.   On: 06/20/2017 01:19     Problem List  Principal Problem:   Asthma exacerbation Active Problems:   OSA (obstructive sleep apnea)   Essential hypertension   Morbid obesity (HCC)   Acute asthma exacerbation   Assessment: This is a 36 year old African-American female with past medical history as stated earlier, who presents with two-day history of worsening cough, shortness of breath, wheezing. She appears to have acute asthma exacerbation. Unfortunately,  she continues to smoke cigarettes. Chest x-ray did not show any infiltrates.  Plan: #1 acute asthma exacerbation: This was her second visit to the emergency department for same issue. She'll be placed on scheduled nebulizer treatments. She'll be given scheduled nebulized budesonide. Azithromycin. Systemic steroids will also be given. Peak flows. Nicotine patch. Patient counseled regarding her tobacco abuse. She was noted to be tachycardic, however, this is most likely secondary to the nebulizer treatments and her initial respiratory distress. Heart rate has improved. Lactic acid level was noted to be mildly elevated. Sepsis is a possibility, though less likely. Lactic acid level will be repeated. Continue  IV fluids at a lower rate.  #2 history of essential hypertension: Monitor blood pressure closely. Continue amlodipine from tomorrow. Hydralazine as needed.  #3 history of obstructive sleep apnea: Apparently this was recently diagnosed. She has not picked up her CPAP yet. Initiate CPAP in the hospital at nighttime.  #4 Morbid obesity: Body mass index is 54.52 kg/m. She will need weight loss counseling. Check TSH.  #5 Microcytic anemia: No evidence for overt bleeding. Check anemia panel.  DVT Prophylaxis: Subcutaneous heparin Code Status: Full code Family Communication: Discussed with the patient  Consults called: None   Severity of Illness: The appropriate patient status for this patient is INPATIENT. Inpatient status is judged to be reasonable and necessary in order to provide the required intensity of service to ensure the patient's safety. The patient's presenting symptoms, physical exam findings, and initial radiographic and laboratory data in the context of their chronic comorbidities is felt to place them at high risk for further clinical deterioration. Furthermore, it is not anticipated that the patient will be medically stable for discharge from the hospital within 2 midnights of admission. The following factors support the patient status of inpatient.   " The patient's presenting symptoms include shortness of breath, wheezing. " The worrisome physical exam findings include respiratory wheezes, dyspnea. " The initial radiographic and laboratory data are worrisome because of anemia. " The chronic co-morbidities include hypertension, morbid obesity, OSA.   * I certify that at the point of admission it is my clinical judgment that the patient will require inpatient hospital care spanning beyond 2 midnights from the point of admission due to high intensity of service, high risk for further deterioration and high frequency of surveillance required.*  Further management decisions will  depend on results of further testing and patient's response to treatment.   [facility]  [facility] Pager [phone]  If 7PM-7AM, please contact night-coverage [email or web address] Password [facility]  06/20/2017, 6:05 PM

## 2017-06-20 NOTE — ED Notes (Signed)
Pt on cardiac monitor and auto VS 

## 2017-06-21 DIAGNOSIS — D509 Iron deficiency anemia, unspecified: Secondary | ICD-10-CM

## 2017-06-21 LAB — CBC
HEMATOCRIT: 33.7 % — AB (ref 36.0–46.0)
HEMOGLOBIN: 10.4 g/dL — AB (ref 12.0–15.0)
MCH: 23.1 pg — ABNORMAL LOW (ref 26.0–34.0)
MCHC: 30.9 g/dL (ref 30.0–36.0)
MCV: 74.7 fL — ABNORMAL LOW (ref 78.0–100.0)
Platelets: 291 10*3/uL (ref 150–400)
RBC: 4.51 MIL/uL (ref 3.87–5.11)
RDW: 18 % — AB (ref 11.5–15.5)
WBC: 18.9 10*3/uL — AB (ref 4.0–10.5)

## 2017-06-21 LAB — BASIC METABOLIC PANEL
ANION GAP: 7 (ref 5–15)
BUN: 8 mg/dL (ref 6–20)
CHLORIDE: 108 mmol/L (ref 101–111)
CO2: 21 mmol/L — ABNORMAL LOW (ref 22–32)
Calcium: 8.8 mg/dL — ABNORMAL LOW (ref 8.9–10.3)
Creatinine, Ser: 0.62 mg/dL (ref 0.44–1.00)
Glucose, Bld: 160 mg/dL — ABNORMAL HIGH (ref 65–99)
POTASSIUM: 4.3 mmol/L (ref 3.5–5.1)
SODIUM: 136 mmol/L (ref 135–145)

## 2017-06-21 LAB — VITAMIN B12: Vitamin B-12: 563 pg/mL (ref 180–914)

## 2017-06-21 LAB — IRON AND TIBC
IRON: 17 ug/dL — AB (ref 28–170)
Saturation Ratios: 5 % — ABNORMAL LOW (ref 10.4–31.8)
TIBC: 347 ug/dL (ref 250–450)
UIBC: 330 ug/dL

## 2017-06-21 LAB — FERRITIN: Ferritin: 20 ng/mL (ref 11–307)

## 2017-06-21 LAB — FOLATE: FOLATE: 9.7 ng/mL (ref >5.9)

## 2017-06-21 LAB — HIV ANTIBODY (ROUTINE TESTING W REFLEX): HIV SCREEN 4TH GENERATION: NONREACTIVE

## 2017-06-21 LAB — RETICULOCYTES
RBC.: 4.51 MIL/uL (ref 3.87–5.11)
RETIC COUNT ABSOLUTE: 45.1 10*3/uL (ref 19.0–186.0)
Retic Ct Pct: 1 % (ref 0.4–3.1)

## 2017-06-21 MED ORDER — ALUM & MAG HYDROXIDE-SIMETH 200-200-20 MG/5ML PO SUSP
15.0000 mL | ORAL | Status: DC | PRN
  Administered 2017-06-21: 15 mL via ORAL
  Filled 2017-06-21: qty 30

## 2017-06-21 NOTE — Progress Notes (Signed)
[facility] PROGRESS NOTE  Morgan Bryant ZOX:[id number] DOB: 1981/01/24 DOA: 06/19/2017  PCP: Amelia Jo, FNP  Brief History/Interval Summary: 36 year old African-American female with a past medical history of obesity, hypertension, asthma, recently diagnosed sleep apnea, presented with cough, shortness of breath, wheezing. She was seen in the emergency department and was treated and was discharged home. She presented back in 24 hours feeling worse. She had to be hospitalized for further management of acute asthma exacerbation.  Reason for Visit: Acute asthma exacerbation  Consultants: None  Procedures: None  Antibiotics: Azithromycin  Subjective/Interval History: Patient feels about the same as yesterday. Not much improvement. Continues to have wheezing. Continues to have cough. Denies any chest pain. No nausea, vomiting.  ROS: No headaches  Objective:  Vital Signs  Vitals:   06/20/17 2109 06/20/17 2225 06/21/17 0302 06/21/17 0629  BP: (!) 171/87   112/65  Pulse: 99   89  Resp: (!) 22 18  20   Temp: 98 F (36.7 C)   98.6 F (37 C)  TempSrc: Oral   Oral  SpO2: 99%  98% 95%  Weight:      Height:        Intake/Output Summary (Last 24 hours) at 06/21/17 1251 Last data filed at 06/21/17 1203  Gross per 24 hour  Intake          4384.17 ml  Output                0 ml  Net          4384.17 ml   Filed Weights   06/19/17 2327  Weight: (!) 172.4 kg (380 lb)    General appearance: alert, cooperative, appears stated age, no distress and morbidly obese Resp: Improved air entry today compared to yesterday. Continues to have end expiratory wheezing. No crackles. Cardio: regular rate and rhythm, S1, S2 normal, no murmur, click, rub or gallop GI: soft, non-tender; bowel sounds normal; no masses,  no organomegaly Extremities: extremities normal, atraumatic, no cyanosis or edema Pulses: 2+ and symmetric Neurologic: No focal neurological deficits.  Lab  Results:  Data Reviewed: I have personally reviewed following labs and imaging studies  CBC:  Recent Labs Lab 06/20/17 0000 06/21/17 0413  WBC 18.0* 18.9*  NEUTROABS 12.1*  --   HGB 10.4* 10.4*  HCT 33.2* 33.7*  MCV 74.1* 74.7*  PLT 237 291    Basic Metabolic Panel:  Recent Labs Lab 06/20/17 0000 06/21/17 0413  NA 137 136  K 3.7 4.3  CL 105 108  CO2 24 21*  GLUCOSE 121* 160*  BUN 8 8  CREATININE 0.91 0.62  CALCIUM 8.6* 8.8*    GFR: Estimated Creatinine Clearance: 170.6 mL/min (by C-G formula based on SCr of 0.62 mg/dL).  Thyroid Function Tests:  Recent Labs  06/20/17 1817  TSH 0.201*    Anemia Panel:  Recent Labs  06/21/17 0413  VITAMINB12 563  FOLATE 9.7  FERRITIN 20  TIBC 347  IRON 17*  RETICCTPCT 1.0     Radiology Studies: Dg Chest 2 View  Result Date: 06/20/2017 CLINICAL DATA:  Shortness of breath, wheezing, cough EXAM: CHEST  2 VIEW COMPARISON:  04/08/2016 FINDINGS: Lungs are essentially clear. No focal consolidation. No pleural effusion or pneumothorax. The heart is normal in size. Visualized osseous structures are within normal limits. IMPRESSION: No evidence of acute cardiopulmonary disease. Electronically Signed   By: Charline Bills M.D.   On: 06/20/2017 01:19     Medications:  Scheduled: . amLODipine  5 mg Oral Daily  . azithromycin  500 mg Oral Daily  . budesonide  0.5 mg Nebulization BID  . heparin  5,000 Units Subcutaneous Q8H  . ipratropium  0.5 mg Nebulization QID  . levalbuterol  1.25 mg Nebulization QID  . mouth rinse  15 mL Mouth Rinse BID  . methylPREDNISolone (SOLU-MEDROL) injection  80 mg Intravenous Q6H  . nicotine  14 mg Transdermal Daily   Continuous: . sodium chloride 75 mL/hr at 06/21/17 0803   YEM:[id number] **OR** acetaminophen, bisacodyl, dextromethorphan-guaiFENesin, hydrALAZINE, levalbuterol, ondansetron **OR** ondansetron (ZOFRAN) IV, oxyCODONE  Assessment/Plan:  Principal Problem:   Asthma  exacerbation Active Problems:   OSA (obstructive sleep apnea)   Essential hypertension   Morbid obesity (HCC)   Acute asthma exacerbation    Acute asthma exacerbation Could have been triggered by a viral syndrome. She also smokes cigarettes. Patient stable. Has not shown much improvement. Continue current management for now with nebulizer treatments, steroids, azithromycin. Peak flows. Patient counseled regarding her smoking. Continue nicotine patch. Lactic acid level normal. Sepsis ruled out. Elevated WBC, likely due to steroids.  History of essential Hypertension. Continue to monitor blood pressures closely. Continue home medications. Hydralazine as needed.  Recently diagnosed obstructive sleep apnea. Patient has not picked up her CPAP yet. Continue CPAP here in the hospital at nighttime.  Microcytic anemia. No evidence for overt bleeding. Anemia panel reviewed. Ferritin 20. Iron 17. TIBC 347, saturation 5%. B 12 563, folate 9.7. She will benefit from iron supplementation.  Morbid obesity BMI is 54.52. She will need weight loss counseling. TSH noted to be low, actually. Will check free T4.  DVT Prophylaxis: Subcutaneous heparin    Code Status: Full code  Family Communication: Discussed with patient  Disposition Plan: Management as outlined above.    LOS: 1 day   [facility]  [facility] Pager [phone] 06/21/2017, 12:51 PM  If 7PM-7AM, please contact night-coverage at [email or web address], password [facility]

## 2017-06-22 LAB — BASIC METABOLIC PANEL
Anion gap: 6 (ref 5–15)
BUN: 16 mg/dL (ref 6–20)
CALCIUM: 8.8 mg/dL — AB (ref 8.9–10.3)
CO2: 24 mmol/L (ref 22–32)
CREATININE: 0.68 mg/dL (ref 0.44–1.00)
Chloride: 108 mmol/L (ref 101–111)
Glucose, Bld: 142 mg/dL — ABNORMAL HIGH (ref 65–99)
Potassium: 4.3 mmol/L (ref 3.5–5.1)
SODIUM: 138 mmol/L (ref 135–145)

## 2017-06-22 LAB — CBC
HCT: 31.9 % — ABNORMAL LOW (ref 36.0–46.0)
HEMOGLOBIN: 9.9 g/dL — AB (ref 12.0–15.0)
MCH: 23.1 pg — ABNORMAL LOW (ref 26.0–34.0)
MCHC: 31 g/dL (ref 30.0–36.0)
MCV: 74.5 fL — ABNORMAL LOW (ref 78.0–100.0)
PLATELETS: 313 10*3/uL (ref 150–400)
RBC: 4.28 MIL/uL (ref 3.87–5.11)
RDW: 18.1 % — AB (ref 11.5–15.5)
WBC: 18.2 10*3/uL — ABNORMAL HIGH (ref 4.0–10.5)

## 2017-06-22 LAB — T4, FREE: FREE T4: 0.93 ng/dL (ref 0.61–1.12)

## 2017-06-22 NOTE — Progress Notes (Signed)
[facility] PROGRESS NOTE  Morgan Bryant ZOX:[id number] DOB: July 25, 1981 DOA: 06/19/2017  PCP: Amelia Jo, FNP  Brief History/Interval Summary: 36 year old African-American female with a past medical history of obesity, hypertension, asthma, recently diagnosed sleep apnea, presented with cough, shortness of breath, wheezing. She was seen in the emergency department and was treated and was discharged home. She presented back in 24 hours feeling worse. She had to be hospitalized for further management of acute asthma exacerbation.  Reason for Visit: Acute asthma exacerbation  Consultants: None  Procedures: None  Antibiotics: Azithromycin  Subjective/Interval History: Patient states that she is starting to feel better. Not getting short of breath with exertion as before. Denies any chest pain. No nausea, vomiting.   ROS: Denies any headaches.  Objective:  Vital Signs  Vitals:   06/21/17 2206 06/22/17 0636 06/22/17 0901 06/22/17 1134  BP:  132/66    Pulse: 100 73    Resp:  20    Temp:  98.6 F (37 C)    TempSrc:  Axillary    SpO2: 96% 99% 96% 99%  Weight:      Height:        Intake/Output Summary (Last 24 hours) at 06/22/17 1203 Last data filed at 06/21/17 2200  Gross per 24 hour  Intake             1475 ml  Output                0 ml  Net             1475 ml   Filed Weights   06/19/17 2327  Weight: (!) 172.4 kg (380 lb)    General appearance: Awake, alert. In no distress. Resp: Improving air entry bilaterally. Less wheezes compared to yesterday. No crackles. Normal effort at rest Cardio: S1, S2 is regular. No S3, S4. No rubs, murmurs, or bruit GI: Abdomen is soft. Nontender, nondistended. Bowel sounds are present. No masses, organomegaly Extremities: No edema Neurologic: No focal neurological deficits.  Lab Results:  Data Reviewed: I have personally reviewed following labs and imaging studies  CBC:  Recent Labs Lab 06/20/17 0000  06/21/17 0413 06/22/17 0447  WBC 18.0* 18.9* 18.2*  NEUTROABS 12.1*  --   --   HGB 10.4* 10.4* 9.9*  HCT 33.2* 33.7* 31.9*  MCV 74.1* 74.7* 74.5*  PLT 237 291 313    Basic Metabolic Panel:  Recent Labs Lab 06/20/17 0000 06/21/17 0413 06/22/17 0447  NA 137 136 138  K 3.7 4.3 4.3  CL 105 108 108  CO2 24 21* 24  GLUCOSE 121* 160* 142*  BUN 8 8 16   CREATININE 0.91 0.62 0.68  CALCIUM 8.6* 8.8* 8.8*    GFR: Estimated Creatinine Clearance: 170.6 mL/min (by C-G formula based on SCr of 0.68 mg/dL).  Thyroid Function Tests:  Recent Labs  06/20/17 1817 06/22/17 0447  TSH 0.201*  --   FREET4  --  0.93    Anemia Panel:  Recent Labs  06/21/17 0413  VITAMINB12 563  FOLATE 9.7  FERRITIN 20  TIBC 347  IRON 17*  RETICCTPCT 1.0     Radiology Studies: No results found.   Medications:  Scheduled: . amLODipine  5 mg Oral Daily  . azithromycin  500 mg Oral Daily  . budesonide  0.5 mg Nebulization BID  . heparin  5,000 Units Subcutaneous Q8H  . ipratropium  0.5 mg Nebulization QID  . levalbuterol  1.25 mg Nebulization QID  . mouth rinse  15 mL Mouth Rinse BID  . methylPREDNISolone (SOLU-MEDROL) injection  80 mg Intravenous Q6H  . nicotine  14 mg Transdermal Daily   Continuous: . sodium chloride 75 mL/hr at 06/21/17 2143   QVZ:[id number] **OR** acetaminophen, alum & mag hydroxide-simeth, bisacodyl, dextromethorphan-guaiFENesin, hydrALAZINE, levalbuterol, ondansetron **OR** ondansetron (ZOFRAN) IV, oxyCODONE  Assessment/Plan:  Principal Problem:   Asthma exacerbation Active Problems:   OSA (obstructive sleep apnea)   Essential hypertension   Morbid obesity (HCC)   Acute asthma exacerbation    Acute asthma exacerbation Could have been triggered by a viral syndrome. She also smokes cigarettes. Patient is slowly improving. Continue current management for now with nebulizer treatments, steroids, azithromycin. Peak flows. Patient counseled regarding her  smoking. Continue nicotine patch. Lactic acid level normal. Sepsis ruled out. Elevated WBC, likely due to steroids. HIV nonreactive.  History of essential Hypertension. Blood pressure is reasonably well controlled. Continue to monitor.  Recently diagnosed obstructive sleep apnea. Patient has not picked up her CPAP yet. Continue CPAP here in the hospital at nighttime. Patient educated about importance of being compliant with use of CPAP.  Microcytic anemia. No evidence for overt bleeding. Mild drop in hemoglobin is likely due to dilution. Anemia panel reviewed. Ferritin 20. Iron 17. TIBC 347, saturation 5%. B 12 563, folate 9.7. She will benefit from iron supplementation at discharge.  Morbid obesity BMI is 54.52. She will need weight loss counseling. Nutritionist consult. TSH was noted to be low but free T4 is normal.  DVT Prophylaxis: Subcutaneous heparin    Code Status: Full code  Family Communication: Discussed with patient  Disposition Plan: Management as outlined above. Mobilize.    LOS: 2 days   [facility]  [facility] Pager [phone] 06/22/2017, 12:03 PM  If 7PM-7AM, please contact night-coverage at [email or web address], password [facility]

## 2017-06-22 NOTE — Progress Notes (Signed)
SATURATION QUALIFICATIONS: (This note is used to comply with regulatory documentation for home oxygen)  Patient Saturations on Room Air at Rest = 98%  Patient Saturations on Room Air while Ambulating = 89%  Patient Saturations on 2 Liters of oxygen while Ambulating = 94%  Please briefly explain why patient needs home oxygen: 

## 2017-06-23 MED ORDER — METHYLPREDNISOLONE SODIUM SUCC 125 MG IJ SOLR
80.0000 mg | Freq: Three times a day (TID) | INTRAMUSCULAR | Status: DC
  Administered 2017-06-23 – 2017-06-24 (×3): 80 mg via INTRAVENOUS
  Filled 2017-06-23 (×3): qty 2

## 2017-06-23 NOTE — Plan of Care (Signed)
Problem: Food- and Nutrition-Related Knowledge Deficit (NB-1.1) Goal: Nutrition education Formal process to instruct or train a patient/client in a skill or to impart knowledge to help patients/clients voluntarily manage or modify food choices and eating behavior to maintain or improve health. Outcome: Completed/Met Date Met: 06/23/17  RD consulted for nutrition education regarding weight loss.  Body mass index is 54.52 kg/m. Pt meets criteria for morbid obesity based on current BMI.  RD provided "General, Healthful Nutrition Therapy" and "Weight Loss Tips" handouts from the Academy of [employer]etics. Emphasized the importance of serving sizes and provided examples of correct portions of common foods. Discussed importance of controlled and consistent intake throughout the day. Provided examples of ways to balance meals/snacks and encouraged intake of high-fiber, whole grain complex carbohydrates. Emphasized the importance of hydration with calorie-free beverages and limiting sugar-sweetened beverages. Encouraged pt to discuss physical activity options with physician. Teach back method used.  Expect good compliance. Pt seems motivated for change. Patient has met with a MD regarding bariatric surgery recently and he stated she needed to lose 60 lb prior to surgery. She states she has stopped drinking soda during this admission and will continue to not drink them after discharge. Usually skips breakfast, encouraged her to always consume at least 3 meals a day. Reviewed high protein snacks.   Current diet order is 2GMNA , patient is consuming approximately 100% of meals at this time. Labs and medications reviewed. No further nutrition interventions warranted at this time. If additional nutrition issues arise, please re-consult RD.  Clayton Bibles, MS, RD, LDN Pager: [phone] After Hours Pager: [phone]

## 2017-06-23 NOTE — Progress Notes (Signed)
[facility] PROGRESS NOTE  Morgan Bryant JEH:[id number] DOB: Jan 04, 1981 DOA: 06/19/2017  PCP: Morgan Jo, FNP  Brief History/Interval Summary: 36 year old African-American female with a past medical history of obesity, hypertension, asthma, recently diagnosed sleep apnea, presented with cough, shortness of breath, wheezing. She was seen in the emergency department and was treated and was discharged home. She presented back in 24 hours feeling worse. She had to be hospitalized for further management of acute asthma exacerbation.  Reason for Visit: Acute asthma exacerbation  Consultants: None  Procedures: None  Antibiotics: Azithromycin  Subjective/Interval History: Patient states that she is feeling better. She ambulated yesterday, but did get short of breath. Less wheezing compared to the last few days. Denies chest pains.   ROS: No nausea or vomiting  Objective:  Vital Signs  Vitals:   06/22/17 2055 06/22/17 2237 06/23/17 0416 06/23/17 0816  BP:  (!) 135/93 110/74   Pulse:  78 63   Resp:  17 18   Temp:  98.9 F (37.2 C) 97.8 F (36.6 C)   TempSrc:  Oral Axillary   SpO2: 93% 97% 97% 96%  Weight:      Height:        Intake/Output Summary (Last 24 hours) at 06/23/17 0925 Last data filed at 06/23/17 0200  Gross per 24 hour  Intake             1080 ml  Output                0 ml  Net             1080 ml   Filed Weights   06/19/17 2327  Weight: (!) 172.4 kg (380 lb)    General appearance: Awake, alert. No distress. Resp: Normal effort. Improved air entry bilaterally. Continues to have scattered wheezes. No crackles.  Cardio: S1, S2 is normal, regular. No S3, S4. No rubs, murmurs GI: Abdomen is obese, soft, nontender, nondistended. Extremities: No edema Neurologic: No focal neurological deficits.  Lab Results:  Data Reviewed: I have personally reviewed following labs and imaging studies  CBC:  Recent Labs Lab 06/20/17 0000 06/21/17 0413  06/22/17 0447  WBC 18.0* 18.9* 18.2*  NEUTROABS 12.1*  --   --   HGB 10.4* 10.4* 9.9*  HCT 33.2* 33.7* 31.9*  MCV 74.1* 74.7* 74.5*  PLT 237 291 313    Basic Metabolic Panel:  Recent Labs Lab 06/20/17 0000 06/21/17 0413 06/22/17 0447  NA 137 136 138  K 3.7 4.3 4.3  CL 105 108 108  CO2 24 21* 24  GLUCOSE 121* 160* 142*  BUN 8 8 16   CREATININE 0.91 0.62 0.68  CALCIUM 8.6* 8.8* 8.8*    GFR: Estimated Creatinine Clearance: 170.6 mL/min (by C-G formula based on SCr of 0.68 mg/dL).  Thyroid Function Tests:  Recent Labs  06/20/17 1817 06/22/17 0447  TSH 0.201*  --   FREET4  --  0.93    Anemia Panel:  Recent Labs  06/21/17 0413  VITAMINB12 563  FOLATE 9.7  FERRITIN 20  TIBC 347  IRON 17*  RETICCTPCT 1.0     Radiology Studies: No results found.   Medications:  Scheduled: . amLODipine  5 mg Oral Daily  . azithromycin  500 mg Oral Daily  . budesonide  0.5 mg Nebulization BID  . heparin  5,000 Units Subcutaneous Q8H  . ipratropium  0.5 mg Nebulization QID  . levalbuterol  1.25 mg Nebulization QID  . mouth rinse  15  mL Mouth Rinse BID  . methylPREDNISolone (SOLU-MEDROL) injection  80 mg Intravenous Q8H  . nicotine  14 mg Transdermal Daily   Continuous:  ZOX:[id number] **OR** acetaminophen, alum & mag hydroxide-simeth, bisacodyl, dextromethorphan-guaiFENesin, hydrALAZINE, levalbuterol, ondansetron **OR** ondansetron (ZOFRAN) IV, oxyCODONE  Assessment/Plan:  Principal Problem:   Asthma exacerbation Active Problems:   OSA (obstructive sleep apnea)   Essential hypertension   Morbid obesity (HCC)   Acute asthma exacerbation    Acute asthma exacerbation Could have been triggered by a viral syndrome. She also smokes cigarettes. Patient has been slow to improve but is finally starting to feel better. Start tapering steroids. Continue the doesn't treatments and azithromycin. She tells me that her peak flow has improved to greater than 300. Elevated  WBCs, most likely due to steroids. HIV was nonreactive.   History of essential Hypertension. Blood pressure is reasonably well-controlled. Continue to monitor.  Recently diagnosed obstructive sleep apnea. Patient has not picked up her CPAP yet. Continue CPAP here in the hospital at nighttime. Patient educated about importance of being compliant with use of CPAP.  Microcytic anemia. No evidence for overt bleeding. Mild drop in hemoglobin is likely due to dilution. Anemia panel reviewed. Ferritin 20. Iron 17. TIBC 347, saturation 5%. B 12 563, folate 9.7. She will benefit from iron supplementation at discharge.  Morbid obesity BMI is 54.52. She will need weight loss counseling. Nutritionist consult. TSH was noted to be low but free T4 is normal.  DVT Prophylaxis: Subcutaneous heparin    Code Status: Full code  Family Communication: Discussed with patient  Disposition Plan: Continue to mobilize. Management as outlined above.    LOS: 3 days   [facility]  [facility] Pager [phone] 06/23/2017, 9:25 AM  If 7PM-7AM, please contact night-coverage at [email or web address], password [facility]

## 2017-06-24 LAB — CBC
HCT: 32.7 % — ABNORMAL LOW (ref 36.0–46.0)
HEMOGLOBIN: 10.6 g/dL — AB (ref 12.0–15.0)
MCH: 23.7 pg — AB (ref 26.0–34.0)
MCHC: 32.4 g/dL (ref 30.0–36.0)
MCV: 73.2 fL — ABNORMAL LOW (ref 78.0–100.0)
Platelets: 248 10*3/uL (ref 150–400)
RBC: 4.47 MIL/uL (ref 3.87–5.11)
RDW: 17.9 % — ABNORMAL HIGH (ref 11.5–15.5)
WBC: 17.2 10*3/uL — AB (ref 4.0–10.5)

## 2017-06-24 LAB — BASIC METABOLIC PANEL
ANION GAP: 8 (ref 5–15)
BUN: 19 mg/dL (ref 6–20)
CHLORIDE: 106 mmol/L (ref 101–111)
CO2: 22 mmol/L (ref 22–32)
Calcium: 8.5 mg/dL — ABNORMAL LOW (ref 8.9–10.3)
Creatinine, Ser: 0.81 mg/dL (ref 0.44–1.00)
GFR calc non Af Amer: >60 mL/min (ref >60)
Glucose, Bld: 143 mg/dL — ABNORMAL HIGH (ref 65–99)
POTASSIUM: 4.7 mmol/L (ref 3.5–5.1)
SODIUM: 136 mmol/L (ref 135–145)

## 2017-06-24 MED ORDER — ZOLPIDEM TARTRATE 5 MG PO TABS
5.0000 mg | ORAL_TABLET | Freq: Every evening | ORAL | Status: DC | PRN
  Administered 2017-06-24: 5 mg via ORAL
  Filled 2017-06-24: qty 1

## 2017-06-24 MED ORDER — DOCUSATE SODIUM 50 MG PO CAPS: 100.0000 mg | ORAL_CAPSULE | Freq: Two times a day (BID) | ORAL | 0 refills | Status: DC

## 2017-06-24 MED ORDER — ALBUTEROL SULFATE (2.5 MG/3ML) 0.083% IN NEBU: 2.5000 mg | INHALATION_SOLUTION | Freq: Four times a day (QID) | RESPIRATORY_TRACT | 12 refills | Status: DC | PRN

## 2017-06-24 MED ORDER — NICOTINE 14 MG/24HR TD PT24: 14.0000 mg | MEDICATED_PATCH | Freq: Every day | TRANSDERMAL | 0 refills | Status: DC

## 2017-06-24 MED ORDER — AZITHROMYCIN 500 MG PO TABS: 500.0000 mg | ORAL_TABLET | Freq: Every day | ORAL | 0 refills | Status: AC

## 2017-06-24 MED ORDER — PREDNISONE 20 MG PO TABS: ORAL_TABLET | ORAL | 0 refills | Status: DC

## 2017-06-24 MED ORDER — FERROUS SULFATE 325 (65 FE) MG PO TABS: 325.0000 mg | ORAL_TABLET | Freq: Two times a day (BID) | ORAL | 1 refills | Status: DC

## 2017-06-24 MED ORDER — FLUTICASONE PROPIONATE (INHAL) 50 MCG/BLIST IN AEPB: 2.0000 | INHALATION_SPRAY | Freq: Two times a day (BID) | RESPIRATORY_TRACT | 1 refills | Status: DC

## 2017-06-24 NOTE — Care Management Note (Signed)
Case Management Note  Patient Details  Name: Morgan Bryant MRN: [id number] Date of Birth: 03-07-1981  Subjective/Objective:  Pt admitted with Asthma exacerbation                  Action/Plan: Plan to discharge home with no needs   Expected Discharge Date:  06/24/17               Expected Discharge Plan:  Home/Self Care  In-House Referral:     Discharge planning Services  CM Consult  Post Acute Care Choice:    Choice offered to:     DME Arranged:    DME Agency:     HH Arranged:    HH Agency:     Status of Service:  Completed, signed off  If discussed at [employer] of Stay Meetings, dates discussed:    Additional CommentsGeni Bers, RN 06/24/2017, 12:28 PM

## 2017-06-24 NOTE — Progress Notes (Signed)
[facility] aware of home Neb machine.

## 2017-06-24 NOTE — Discharge Instructions (Signed)
Asthma Attack Prevention, Adult Although you may not be able to control the fact that you have asthma, you can take actions to prevent episodes of asthma (asthma attacks). These actions include:  Creating a written plan for managing and treating your asthma attacks (asthma action plan).  Monitoring your asthma.  Avoiding things that can irritate your airways or make your asthma symptoms worse (asthma triggers).  Taking your medicines as directed.  Acting quickly if you have signs or symptoms of an asthma attack. What are some ways to prevent an asthma attack? Create a plan Work with your health care provider to create an asthma action plan. This plan should include:  A list of your asthma triggers and how to avoid them.  A list of symptoms that you experience during an asthma attack.  Information about when to take medicine and how much medicine to take.  Information to help you understand your peak flow measurements.  Contact information for your health care providers.  Daily actions that you can take to control asthma. Monitor your asthma   To monitor your asthma:  Use your peak flow meter every morning and every evening for 2-3 weeks. Record the results in a journal. A drop in your peak flow numbers on one or more days may mean that you are starting to have an asthma attack, even if you are not having symptoms.  When you have asthma symptoms, write them down in a journal. Avoid asthma triggers   Work with your health care provider to find out what your asthma triggers are. This can be done by:  Being tested for allergies.  Keeping a journal that notes when asthma attacks occur and what may have contributed to them.  Asking your health care provider whether other medical conditions make your asthma worse. Common asthma triggers include:  Dust.  Smoke. This includes campfire smoke and secondhand smoke from tobacco products.  Pet dander.  Trees, grasses or  pollens.  Very cold, dry, or humid air.  Mold.  Foods that contain high amounts of sulfites.  Strong smells.  Engine exhaust and air pollution.  Aerosol sprays and fumes from household cleaners.  Household pests and their droppings, including dust mites and cockroaches.  Certain medicines, including NSAIDs. Once you have determined your asthma triggers, take steps to avoid them. Depending on your triggers, you may be able to reduce the chance of an asthma attack by:  Keeping your home clean. Have someone dust and vacuum your home for you 1 or 2 times a week. If possible, have them use a high-efficiency particulate arrestance (HEPA) vacuum.  Washing your sheets weekly in hot water.  Using allergy-proof mattress covers and casings on your bed.  Keeping pets out of your home.  Taking care of mold and water problems in your home.  Avoiding areas where people smoke.  Avoiding using strong perfumes or odor sprays.  Avoid spending a lot of time outdoors when pollen counts are high and on very windy days.  Talking with your health care provider before stopping or starting any new medicines. Medicines Take over-the-counter and prescription medicines only as told by your health care provider. Many asthma attacks can be prevented by carefully following your medicine schedule. Taking your medicines correctly is especially important when you cannot avoid certain asthma triggers. Even if you are doing well, do not stop taking your medicine and do not take less medicine. Act quickly If an asthma attack happens, acting quickly can decrease how severe   it is and how long it lasts. Take these actions:  Pay attention to your symptoms. If you are coughing, wheezing, or having difficulty breathing, do not wait to see if your symptoms go away on their own. Follow your asthma action plan.  If you have followed your asthma action plan and your symptoms are not improving, call your health care  provider or seek immediate medical care at the nearest hospital. It is important to write down how often you need to use your fast-acting rescue inhaler. You can track how often you use an inhaler in your journal. If you are using your rescue inhaler more often, it may mean that your asthma is not under control. Adjusting your asthma treatment plan may help you to prevent future asthma attacks and help you to gain better control of your condition. How can I prevent an asthma attack when I exercise?   Exercise is a common asthma trigger. To prevent asthma attacks during exercise:  Follow advice from your health care provider about whether you should use your fast-acting inhaler before exercising. Many people with asthma experience exercise-induced bronchoconstriction (EIB). This condition often worsens during vigorous exercise in cold, humid, or dry environments. Usually, people with EIB can stay very active by using a fast-acting inhaler before exercising.  Avoid exercising outdoors in very cold or humid weather.  Avoid exercising outdoors when pollen counts are high.  Warm up and cool down when exercising.  Stop exercising right away if asthma symptoms start. Consider taking part in exercises that are less likely to cause asthma symptoms such as:  Indoor swimming.  Biking.  Walking.  Hiking.  Playing football. This information is not intended to replace advice given to you by your health care provider. Make sure you discuss any questions you have with your health care provider. Document Released: 10/02/2009 Document Revised: 06/14/2016 Document Reviewed: 03/30/2016 [employer] Interactive Patient Education  2017 [employer]  

## 2017-06-24 NOTE — Discharge Summary (Signed)
[facility]  Physician Discharge Summary   [name] MRN: [id number] DOB/AGE: Jun 16, 1981 36 y.o.  Admit date: 06/19/2017 Discharge date: 06/24/2017  PCP: Morgan Jo, FNP  DISCHARGE DIAGNOSES:  Principal Problem:   Asthma exacerbation Active Problems:   OSA (obstructive sleep apnea)   Essential hypertension   Morbid obesity (HCC)   Acute asthma exacerbation   RECOMMENDATIONS FOR OUTPATIENT FOLLOW UP: 1. Outpatient follow-up with primary care provider 2. Patient told to stop smoking cigarettes 3. Needs further evaluation of her anemia as outpatient. See below.   DISCHARGE CONDITION: fair  Diet recommendation: As before  Filed Weights   06/19/17 2327  Weight: (!) 172.4 kg (380 lb)    INITIAL HISTORY: 36 year old African-American female with a past medical history of obesity, hypertension, asthma, recently diagnosed sleep apnea, presented with cough, shortness of breath, wheezing. She was seen in the emergency department and was treated and was discharged home. She presented back in 24 hours feeling worse. She had to be hospitalized for further management of acute asthma exacerbation.   HOSPITAL COURSE:   Acute asthma exacerbation Could have been triggered by a viral syndrome. She also smokes cigarettes. Patient was slow to improve but finally started to feel better. Wishes to go home today. She was ambulated in the hallway. She did feel somewhat short of breath but her oxygen saturation remained normal. Heart rate did go up, but it came down quickly when she rested. She feels ok to go home today. She will be given prescriptions for nebulizer machine, nebulizer medications, tapering doses of steroids and 2 more days of azithromycin. Elevated WBC is most likely due to steroids. HIV was nonreactive. She will follow-up with her primary care provider. She'll be given a note for work.   History of essential Hypertension. Continue home  medications.  Recently diagnosed obstructive sleep apnea. Patient has not picked up her CPAP yet. She tolerated CPAP here in the hospital. Patient educated about importance of being compliant with use of CPAP.  Microcytic anemia. No evidence for overt bleeding. Mild drop in hemoglobin is likely due to dilution. Anemia panel reviewed. Ferritin 20. Iron 17. TIBC 347, saturation 5%. B 12 563, folate 9.7. She was given a prescription for iron supplements. This will need to be further evaluated as outpatient.  Morbid obesity BMI is 54.52. Seen by nutritionist. TSH was noted to be low but free T4 is normal.  Overall, stable. Wants to go home. Ok for discharge.   PERTINENT LABS:  The results of significant diagnostics from this hospitalization (including imaging, microbiology, ancillary and laboratory) are listed below for reference.      Labs: Basic Metabolic Panel:  Recent Labs Lab 06/20/17 0000 06/21/17 0413 06/22/17 0447 06/24/17 0404  NA 137 136 138 136  K 3.7 4.3 4.3 4.7  CL 105 108 108 106  CO2 24 21* 24 22  GLUCOSE 121* 160* 142* 143*  BUN 8 8 16 19   CREATININE 0.91 0.62 0.68 0.81  CALCIUM 8.6* 8.8* 8.8* 8.5*   CBC:  Recent Labs Lab 06/20/17 0000 06/21/17 0413 06/22/17 0447 06/24/17 0404  WBC 18.0* 18.9* 18.2* 17.2*  NEUTROABS 12.1*  --   --   --   HGB 10.4* 10.4* 9.9* 10.6*  HCT 33.2* 33.7* 31.9* 32.7*  MCV 74.1* 74.7* 74.5* 73.2*  PLT 237 291 313 248    IMAGING STUDIES Dg Chest 2 View  Result Date: 06/20/2017 CLINICAL DATA:  Shortness of breath, wheezing, cough EXAM: CHEST  2  VIEW COMPARISON:  04/08/2016 FINDINGS: Lungs are essentially clear. No focal consolidation. No pleural effusion or pneumothorax. The heart is normal in size. Visualized osseous structures are within normal limits. IMPRESSION: No evidence of acute cardiopulmonary disease. Electronically Signed   By: Charline Bills M.D.   On: 06/20/2017 01:19    DISCHARGE EXAMINATION: Vitals:    06/23/17 2114 06/24/17 0556 06/24/17 0835 06/24/17 1010  BP: 131/66 124/82  (!) 149/93  Pulse: 95 96    Resp: 18 18    Temp: 98.3 F (36.8 C) 98.1 F (36.7 C)    TempSrc: Oral Oral    SpO2: 95% 99% 96%   Weight:      Height:       General appearance: alert, cooperative, appears stated age and no distress Resp: Normal effort. Iimproved air entry bilaterally. Much less wheezing compared to before. Cardio: regular rate and rhythm, S1, S2 normal, no murmur, click, rub or gallop GI: soft, non-tender; bowel sounds normal; no masses,  no organomegaly  DISPOSITION: Home  Discharge Instructions    Call MD for:  difficulty breathing, headache or visual disturbances    Complete by:  As directed    Call MD for:  extreme fatigue    Complete by:  As directed    Call MD for:  persistant dizziness or light-headedness    Complete by:  As directed    Call MD for:  persistant nausea and vomiting    Complete by:  As directed    Call MD for:  severe uncontrolled pain    Complete by:  As directed    Call MD for:  temperature >100.4    Complete by:  As directed    Diet - low sodium heart healthy    Complete by:  As directed    Discharge instructions    Complete by:  As directed    Please take your medications as prescribed. Please be sure to follow up with your primary care provider within one week. Stop smoking cigarettes.  You were cared for by a hospitalist during your hospital stay. If you have any questions about your discharge medications or the care you received while you were in the hospital after you are discharged, you can call the unit and asked to speak with the hospitalist on call if the hospitalist that took care of you is not available. Once you are discharged, your primary care physician will handle any further medical issues. Please note that NO REFILLS for any discharge medications will be authorized once you are discharged, as it is imperative that you return to your primary care  physician (or establish a relationship with a primary care physician if you do not have one) for your aftercare needs so that they can reassess your need for medications and monitor your lab values. If you do not have a primary care physician, you can call [phone] for a physician referral.   Increase activity slowly    Complete by:  As directed       ALLERGIES:  Allergies  Allergen Reactions  . Penicillins Hives    Has patient had a PCN reaction causing immediate rash, facial/tongue/throat swelling, SOB or lightheadedness with hypotension:yes Has patient had a PCN reaction causing severe rash involving mucus membranes or skin necrosis:no Has patient had a PCN reaction that required hospitalization: yes Has patient had a PCN reaction occurring within the last 10 years:no If all of the above answers are "NO", then may proceed with Cephalosporin use.  Current Discharge Medication List    START taking these medications   Details  azithromycin (ZITHROMAX) 500 MG tablet Take 1 tablet (500 mg total) by mouth daily. For 2 more days Qty: 2 tablet, Refills: 0    docusate sodium (COLACE) 50 MG capsule Take 2 capsules (100 mg total) by mouth 2 (two) times daily. Qty: 60 capsule, Refills: 0    ferrous sulfate 325 (65 FE) MG tablet Take 1 tablet (325 mg total) by mouth 2 (two) times daily with a meal. Qty: 60 tablet, Refills: 1    fluticasone (FLOVENT DISKUS) 50 MCG/BLIST diskus inhaler Inhale 2 puffs into the lungs 2 (two) times daily. Qty: 1 Inhaler, Refills: 1    nicotine (NICODERM CQ - DOSED IN MG/24 HOURS) 14 mg/24hr patch Place 1 patch (14 mg total) onto the skin daily. Qty: 28 patch, Refills: 0    predniSONE (DELTASONE) 20 MG tablet Take 3 tablets daily for 4 days, then take 2 tablets daily for 4 days, then take 1 tablet daily for 4 days, then STOP. Qty: 24 tablet, Refills: 0      CONTINUE these medications which have CHANGED   Details  albuterol (PROVENTIL) (2.5 MG/3ML)  0.083% nebulizer solution Take 3 mLs (2.5 mg total) by nebulization every 6 (six) hours as needed for wheezing or shortness of breath. Qty: 75 mL, Refills: 12      CONTINUE these medications which have NOT CHANGED   Details  amLODipine (NORVASC) 5 MG tablet Take 5 mg by mouth daily.    lisinopril (PRINIVIL,ZESTRIL) 20 MG tablet Take 20 mg by mouth daily.      STOP taking these medications     ondansetron (ZOFRAN ODT) 8 MG disintegrating tablet          Follow-up Information    Cousins, Caleen Jobs, FNP. Schedule an appointment as soon as possible for a visit in 1 week(s).   Specialty:  Nurse Practitioner Contact information: [address] [place] [address] [phone]           TOTAL DISCHARGE TIME: 35 minutes  [facility]  [facility] Pager [phone]  06/24/2017, 12:43 PM

## 2017-07-27 ENCOUNTER — Emergency Department (HOSPITAL_BASED_OUTPATIENT_CLINIC_OR_DEPARTMENT_OTHER)
Admission: EM | Admit: 2017-07-27 | Discharge: 2017-07-27 | Disposition: A | Discharge status: Home / Self Care | Payer: No Typology Code available for payment source | Attending: Emergency Medicine | Admitting: Emergency Medicine

## 2017-07-27 ENCOUNTER — Encounter (HOSPITAL_BASED_OUTPATIENT_CLINIC_OR_DEPARTMENT_OTHER): Payer: Self-pay | Admitting: Emergency Medicine

## 2017-07-27 DIAGNOSIS — I10 Essential (primary) hypertension: Secondary | ICD-10-CM | POA: Insufficient documentation

## 2017-07-27 DIAGNOSIS — J45909 Unspecified asthma, uncomplicated: Secondary | ICD-10-CM | POA: Insufficient documentation

## 2017-07-27 DIAGNOSIS — Z79899 Other long term (current) drug therapy: Secondary | ICD-10-CM | POA: Diagnosis not present

## 2017-07-27 DIAGNOSIS — M62838 Other muscle spasm: Secondary | ICD-10-CM | POA: Diagnosis not present

## 2017-07-27 DIAGNOSIS — M25512 Pain in left shoulder: Secondary | ICD-10-CM | POA: Diagnosis present

## 2017-07-27 DIAGNOSIS — F17210 Nicotine dependence, cigarettes, uncomplicated: Secondary | ICD-10-CM | POA: Insufficient documentation

## 2017-07-27 NOTE — Discharge Instructions (Signed)
Take 4 over the counter ibuprofen tablets 3 times a day or 2 over-the-counter naproxen tablets twice a day for pain. Also take tylenol (2 extra strength) four times a day.   Take your arm out of the sling at least 4x a day and do range of motion exercises with your arm.
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
## 2018-08-07 ENCOUNTER — Emergency Department (HOSPITAL_BASED_OUTPATIENT_CLINIC_OR_DEPARTMENT_OTHER): Payer: Self-pay

## 2018-08-07 ENCOUNTER — Encounter (HOSPITAL_BASED_OUTPATIENT_CLINIC_OR_DEPARTMENT_OTHER): Payer: Self-pay | Admitting: Adult Health

## 2018-08-07 ENCOUNTER — Inpatient Hospital Stay (HOSPITAL_BASED_OUTPATIENT_CLINIC_OR_DEPARTMENT_OTHER)
Admission: EM | Admit: 2018-08-07 | Discharge: 2018-08-12 | DRG: 392 | Disposition: A | Discharge status: Home / Self Care | Payer: Self-pay | Attending: General Surgery | Admitting: General Surgery

## 2018-08-07 ENCOUNTER — Other Ambulatory Visit: Payer: Self-pay

## 2018-08-07 DIAGNOSIS — I10 Essential (primary) hypertension: Secondary | ICD-10-CM | POA: Diagnosis present

## 2018-08-07 DIAGNOSIS — Z8249 Family history of ischemic heart disease and other diseases of the circulatory system: Secondary | ICD-10-CM

## 2018-08-07 DIAGNOSIS — G4733 Obstructive sleep apnea (adult) (pediatric): Secondary | ICD-10-CM | POA: Diagnosis present

## 2018-08-07 DIAGNOSIS — F17210 Nicotine dependence, cigarettes, uncomplicated: Secondary | ICD-10-CM | POA: Diagnosis present

## 2018-08-07 DIAGNOSIS — Z6841 Body Mass Index (BMI) 40.0 and over, adult: Secondary | ICD-10-CM

## 2018-08-07 DIAGNOSIS — N739 Female pelvic inflammatory disease, unspecified: Secondary | ICD-10-CM | POA: Diagnosis present

## 2018-08-07 DIAGNOSIS — J45909 Unspecified asthma, uncomplicated: Secondary | ICD-10-CM | POA: Diagnosis present

## 2018-08-07 DIAGNOSIS — N730 Acute parametritis and pelvic cellulitis: Secondary | ICD-10-CM

## 2018-08-07 DIAGNOSIS — K5792 Diverticulitis of intestine, part unspecified, without perforation or abscess without bleeding: Secondary | ICD-10-CM | POA: Diagnosis present

## 2018-08-07 DIAGNOSIS — Z880 Allergy status to penicillin: Secondary | ICD-10-CM

## 2018-08-07 DIAGNOSIS — Z79899 Other long term (current) drug therapy: Secondary | ICD-10-CM

## 2018-08-07 DIAGNOSIS — K5720 Diverticulitis of large intestine with perforation and abscess without bleeding: Principal | ICD-10-CM

## 2018-08-07 DIAGNOSIS — D649 Anemia, unspecified: Secondary | ICD-10-CM | POA: Diagnosis present

## 2018-08-07 LAB — CBC WITH DIFFERENTIAL/PLATELET
Abs Immature Granulocytes: 0.11 10*3/uL — ABNORMAL HIGH (ref 0.00–0.07)
Basophils Absolute: 0 10*3/uL (ref 0.0–0.1)
Basophils Relative: 0 %
EOS ABS: 0.2 10*3/uL (ref 0.0–0.5)
EOS PCT: 1 %
HEMATOCRIT: 35.2 % — AB (ref 36.0–46.0)
Hemoglobin: 10.9 g/dL — ABNORMAL LOW (ref 12.0–15.0)
IMMATURE GRANULOCYTES: 1 %
LYMPHS ABS: 3 10*3/uL (ref 0.7–4.0)
Lymphocytes Relative: 15 %
MCH: 23.7 pg — ABNORMAL LOW (ref 26.0–34.0)
MCHC: 31 g/dL (ref 30.0–36.0)
MCV: 76.7 fL — AB (ref 80.0–100.0)
MONOS PCT: 7 %
Monocytes Absolute: 1.4 10*3/uL — ABNORMAL HIGH (ref 0.1–1.0)
NEUTROS PCT: 76 %
Neutro Abs: 15.2 10*3/uL — ABNORMAL HIGH (ref 1.7–7.7)
Platelets: 308 10*3/uL (ref 150–400)
RBC: 4.59 MIL/uL (ref 3.87–5.11)
RDW: 17.2 % — AB (ref 11.5–15.5)
WBC: 19.9 10*3/uL — ABNORMAL HIGH (ref 4.0–10.5)
nRBC: 0 % (ref 0.0–0.2)

## 2018-08-07 LAB — URINALYSIS, ROUTINE W REFLEX MICROSCOPIC
GLUCOSE, UA: NEGATIVE mg/dL
HGB URINE DIPSTICK: NEGATIVE
KETONES UR: NEGATIVE mg/dL
Nitrite: NEGATIVE
PROTEIN: NEGATIVE mg/dL
Specific Gravity, Urine: >1.03 — ABNORMAL HIGH (ref 1.005–1.030)
pH: 6 (ref 5.0–8.0)

## 2018-08-07 LAB — COMPREHENSIVE METABOLIC PANEL
ALT: 15 U/L (ref 0–44)
AST: 16 U/L (ref 15–41)
Albumin: 3.3 g/dL — ABNORMAL LOW (ref 3.5–5.0)
Alkaline Phosphatase: 71 U/L (ref 38–126)
Anion gap: 9 (ref 5–15)
BILIRUBIN TOTAL: 0.9 mg/dL (ref 0.3–1.2)
BUN: 10 mg/dL (ref 6–20)
CALCIUM: 8.7 mg/dL — AB (ref 8.9–10.3)
CO2: 23 mmol/L (ref 22–32)
CREATININE: 0.72 mg/dL (ref 0.44–1.00)
Chloride: 104 mmol/L (ref 98–111)
GFR calc Af Amer: >60 mL/min (ref >60)
Glucose, Bld: 124 mg/dL — ABNORMAL HIGH (ref 70–99)
POTASSIUM: 3.6 mmol/L (ref 3.5–5.1)
Sodium: 136 mmol/L (ref 135–145)
TOTAL PROTEIN: 7.1 g/dL (ref 6.5–8.1)

## 2018-08-07 LAB — URINALYSIS, MICROSCOPIC (REFLEX)

## 2018-08-07 LAB — WET PREP, GENITAL
SPERM: NONE SEEN
Trich, Wet Prep: NONE SEEN
Yeast Wet Prep HPF POC: NONE SEEN

## 2018-08-07 LAB — LIPASE, BLOOD: LIPASE: 25 U/L (ref 11–51)

## 2018-08-07 LAB — PREGNANCY, URINE: Preg Test, Ur: NEGATIVE

## 2018-08-07 MED ORDER — METOPROLOL TARTRATE 5 MG/5ML IV SOLN: 5.0000 mg | Freq: Four times a day (QID) | INTRAVENOUS | Status: DC | PRN

## 2018-08-07 MED ORDER — ONDANSETRON 4 MG PO TBDP: 4.0000 mg | ORAL_TABLET | Freq: Four times a day (QID) | ORAL | Status: DC | PRN

## 2018-08-07 MED ORDER — SODIUM CHLORIDE 0.9 % IV SOLN
2.0000 g | Freq: Once | INTRAVENOUS | Status: AC
  Administered 2018-08-07: 2 g via INTRAVENOUS

## 2018-08-07 MED ORDER — DIPHENHYDRAMINE HCL 50 MG/ML IJ SOLN: 25.0000 mg | Freq: Four times a day (QID) | INTRAMUSCULAR | Status: DC | PRN

## 2018-08-07 MED ORDER — ALBUTEROL SULFATE (2.5 MG/3ML) 0.083% IN NEBU: 2.5000 mg | INHALATION_SOLUTION | Freq: Four times a day (QID) | RESPIRATORY_TRACT | Status: DC | PRN

## 2018-08-07 MED ORDER — ZOLPIDEM TARTRATE 5 MG PO TABS: 5.0000 mg | ORAL_TABLET | Freq: Every evening | ORAL | Status: DC | PRN

## 2018-08-07 MED ORDER — SIMETHICONE 80 MG PO CHEW
40.0000 mg | CHEWABLE_TABLET | Freq: Four times a day (QID) | ORAL | Status: DC | PRN
  Administered 2018-08-07: 40 mg via ORAL
  Filled 2018-08-07: qty 1

## 2018-08-07 MED ORDER — CEFEPIME HCL 2 G IJ SOLR
INTRAMUSCULAR | Status: AC
  Filled 2018-08-07: qty 2

## 2018-08-07 MED ORDER — OXYCODONE HCL 5 MG PO TABS
5.0000 mg | ORAL_TABLET | ORAL | Status: DC | PRN
  Administered 2018-08-07 – 2018-08-12 (×16): 5 mg via ORAL
  Filled 2018-08-07 (×16): qty 1

## 2018-08-07 MED ORDER — METRONIDAZOLE IN NACL 5-0.79 MG/ML-% IV SOLN
500.0000 mg | Freq: Once | INTRAVENOUS | Status: AC
  Administered 2018-08-07: 500 mg via INTRAVENOUS
  Filled 2018-08-07: qty 100

## 2018-08-07 MED ORDER — NICOTINE 21 MG/24HR TD PT24
21.0000 mg | MEDICATED_PATCH | Freq: Every day | TRANSDERMAL | Status: DC
  Filled 2018-08-07: qty 1

## 2018-08-07 MED ORDER — ENOXAPARIN SODIUM 100 MG/ML ~~LOC~~ SOLN
90.0000 mg | SUBCUTANEOUS | Status: DC
  Administered 2018-08-07 – 2018-08-11 (×5): 90 mg via SUBCUTANEOUS
  Filled 2018-08-07 (×6): qty 1

## 2018-08-07 MED ORDER — SODIUM CHLORIDE 0.9 % IV BOLUS
1000.0000 mL | Freq: Once | INTRAVENOUS | Status: AC
  Administered 2018-08-07: 1000 mL via INTRAVENOUS

## 2018-08-07 MED ORDER — IBUPROFEN 800 MG PO TABS
800.0000 mg | ORAL_TABLET | Freq: Once | ORAL | Status: AC
  Administered 2018-08-07: 800 mg via ORAL
  Filled 2018-08-07: qty 1

## 2018-08-07 MED ORDER — DIPHENHYDRAMINE HCL 25 MG PO CAPS: 25.0000 mg | ORAL_CAPSULE | Freq: Four times a day (QID) | ORAL | Status: DC | PRN

## 2018-08-07 MED ORDER — ACETAMINOPHEN 500 MG PO TABS: 1000.0000 mg | ORAL_TABLET | Freq: Four times a day (QID) | ORAL | Status: DC | PRN

## 2018-08-07 MED ORDER — IOPAMIDOL (ISOVUE-300) INJECTION 61%
100.0000 mL | Freq: Once | INTRAVENOUS | Status: AC | PRN
  Administered 2018-08-07: 100 mL via INTRAVENOUS

## 2018-08-07 MED ORDER — CEFTRIAXONE SODIUM 250 MG IJ SOLR
250.0000 mg | Freq: Once | INTRAMUSCULAR | Status: AC
  Administered 2018-08-07: 250 mg via INTRAMUSCULAR
  Filled 2018-08-07: qty 250

## 2018-08-07 MED ORDER — IBUPROFEN 200 MG PO TABS: 600.0000 mg | ORAL_TABLET | Freq: Three times a day (TID) | ORAL | Status: DC | PRN

## 2018-08-07 MED ORDER — HYDRALAZINE HCL 20 MG/ML IJ SOLN: 10.0000 mg | INTRAMUSCULAR | Status: DC | PRN

## 2018-08-07 MED ORDER — SODIUM CHLORIDE 0.9 % IV SOLN
2.0000 g | Freq: Three times a day (TID) | INTRAVENOUS | Status: DC
  Administered 2018-08-07 – 2018-08-12 (×14): 2 g via INTRAVENOUS
  Filled 2018-08-07 (×16): qty 2

## 2018-08-07 MED ORDER — ONDANSETRON HCL 4 MG/2ML IJ SOLN
4.0000 mg | Freq: Four times a day (QID) | INTRAMUSCULAR | Status: DC | PRN
  Administered 2018-08-11 – 2018-08-12 (×4): 4 mg via INTRAVENOUS
  Filled 2018-08-07 (×4): qty 2

## 2018-08-07 MED ORDER — MORPHINE SULFATE (PF) 4 MG/ML IV SOLN
4.0000 mg | Freq: Once | INTRAVENOUS | Status: AC
  Administered 2018-08-07: 4 mg via INTRAVENOUS
  Filled 2018-08-07: qty 1

## 2018-08-07 MED ORDER — KCL IN DEXTROSE-NACL 20-5-0.45 MEQ/L-%-% IV SOLN
INTRAVENOUS | Status: DC
  Administered 2018-08-07 – 2018-08-12 (×9): via INTRAVENOUS
  Filled 2018-08-07 (×12): qty 1000

## 2018-08-07 MED ORDER — MORPHINE SULFATE (PF) 2 MG/ML IV SOLN
1.0000 mg | INTRAVENOUS | Status: DC | PRN
  Administered 2018-08-08 – 2018-08-10 (×11): 2 mg via INTRAVENOUS
  Filled 2018-08-07 (×11): qty 1

## 2018-08-07 MED ORDER — AZITHROMYCIN 250 MG PO TABS
1000.0000 mg | ORAL_TABLET | Freq: Once | ORAL | Status: AC
  Administered 2018-08-07: 1000 mg via ORAL
  Filled 2018-08-07: qty 4

## 2018-08-07 NOTE — Discharge Instructions (Addendum)
Diverticulitis °Diverticulitis is infection or inflammation of small pouches (diverticula) in the colon that form due to a condition called diverticulosis. Diverticula can trap stool (feces) and bacteria, causing infection and inflammation. °Diverticulitis may cause severe stomach pain and diarrhea. It may lead to tissue damage in the colon that causes bleeding. The diverticula may also burst (rupture) and cause infected stool to enter other areas of the abdomen. °Complications of diverticulitis can include: °· Bleeding. °· Severe infection. °· Severe pain. °· Rupture (perforation) of the colon. °· Blockage (obstruction) of the colon. ° °What are the causes? °This condition is caused by stool becoming trapped in the diverticula, which allows bacteria to grow in the diverticula. This leads to inflammation and infection. °What increases the risk? °You are more likely to develop this condition if: °· You have diverticulosis. The risk for diverticulosis increases if: °? You are overweight or obese. °? You use tobacco products. °? You do not get enough exercise. °· You eat a diet that does not include enough fiber. High-fiber foods include fruits, vegetables, beans, nuts, and whole grains. ° °What are the signs or symptoms? °Symptoms of this condition may include: °· Pain and tenderness in the abdomen. The pain is normally located on the left side of the abdomen, but it may occur in other areas. °· Fever and chills. °· Bloating. °· Cramping. °· Nausea. °· Vomiting. °· Changes in bowel routines. °· Blood in your stool. ° °How is this diagnosed? °This condition is diagnosed based on: °· Your medical history. °· A physical exam. °· Tests to make sure there is nothing else causing your condition. These tests may include: °? Blood tests. °? Urine tests. °? Imaging tests of the abdomen, including X-rays, ultrasounds, MRIs, or CT scans. ° °How is this treated? °Most cases of this condition are mild and can be treated at home.  Treatment may include: °· Taking over-the-counter pain medicines. °· Following a clear liquid diet. °· Taking antibiotic medicines by mouth. °· Rest. ° °More severe cases may need to be treated at a hospital. Treatment may include: °· Not eating or drinking. °· Taking prescription pain medicine. °· Receiving antibiotic medicines through an IV tube. °· Receiving fluids and nutrition through an IV tube. °· Surgery. ° °When your condition is under control, your health care provider may recommend that you have a colonoscopy. This is an exam to look at the entire large intestine. During the exam, a lubricated, bendable tube is inserted into the anus and then passed into the rectum, colon, and other parts of the large intestine. A colonoscopy can show how severe your diverticula are and whether something else may be causing your symptoms. °Follow these instructions at home: °Medicines °· Take over-the-counter and prescription medicines only as told by your health care provider. These include fiber supplements, probiotics, and stool softeners. °· If you were prescribed an antibiotic medicine, take it as told by your health care provider. Do not stop taking the antibiotic even if you start to feel better. °· Do not drive or use heavy machinery while taking prescription pain medicine. °General instructions °· Follow a full liquid diet or another diet as directed by your health care provider. After your symptoms improve, your health care provider may tell you to change your diet. He or she may recommend that you eat a diet that contains at least 25 g (25 grams) of fiber daily. Fiber makes it easier to pass stool. Healthy sources of fiber include: °? Berries. One cup   contains 4-8 grams of fiber. °? Beans or lentils. One half cup contains 5-8 grams of fiber. °? Green vegetables. One cup contains 4 grams of fiber. °· Exercise for at least 30 minutes, 3 times each week. You should exercise hard enough to raise your heart rate and  break a sweat. °· Keep all follow-up visits as told by your health care provider. This is important. You may need a colonoscopy. °Contact a health care provider if: °· Your pain does not improve. °· You have a hard time drinking or eating food. °· Your bowel movements do not return to normal. °Get help right away if: °· Your pain gets worse. °· Your symptoms do not get better with treatment. °· Your symptoms suddenly get worse. °· You have a fever. °· You vomit more than one time. °· You have stools that are bloody, black, or tarry. °Summary °· Diverticulitis is infection or inflammation of small pouches (diverticula) in the colon that form due to a condition called diverticulosis. Diverticula can trap stool (feces) and bacteria, causing infection and inflammation. °· You are at higher risk for this condition if you have diverticulosis and you eat a diet that does not include enough fiber. °· Most cases of this condition are mild and can be treated at home. More severe cases may need to be treated at a hospital. °· When your condition is under control, your health care provider may recommend that you have an exam called a colonoscopy. This exam can show how severe your diverticula are and whether something else may be causing your symptoms. °This information is not intended to replace advice given to you by your health care provider. Make sure you discuss any questions you have with your health care provider. °Document Released: 07/24/2005 Document Revised: 11/16/2016 Document Reviewed: 11/16/2016 °[employer]vier Interactive Patient Education © 2018 [employer] ° °

## 2018-08-07 NOTE — H&P (Signed)
Morgan Bryant April 01, 1981  [id number].    Chief Complaint/Reason for Consult: diverticulitis with microperforation  HPI:  This is a 37 yo severely obese (BMI 38) black female who has a history of HTN, asthma, OSA, who was recently admitted in August of this year with an asthma exacerbation, not her first admission for this, who began having LLQ abdominal pain on Wednesday morning after dropping her kids off at school.  She is a school bus driver.  She thought it was gas pains initially.  Her pain continued to persist.  She took her mother's 887m ibuprofen which significantly helped her pain, but would return when this wore off.  She denies nausea or vomiting.  No diarrhea, actually constipation.  She took a laxative to help move her bowels.  No blood present.  She denies any fevers, but felt hot on the bus, but there is no A/C.  Due to persistent pain, [facility] that revealed diverticulitis with microperforation and a WBC of 19K.  She has never had this pain before and has never had a colonoscopy.  Of note, she lost her medicaid about 6 months ago and has not been able to afford her medications and therefore is not taking them.  [facility].  ROS: ROS: Please see HPI, otherwise all other systems have been reviewed and are negative.  Family History  Problem Relation Age of Onset  . Hypertension Mother   . Cancer Father     Past Medical History:  Diagnosis Date  . Anemia   . Asthma   . Hypertension   . [place]   . Sleep apnea     Past Surgical History:  Procedure Laterality Date  . CESAREAN SECTION      Social History:  reports that she has been smoking cigarettes. She has been smoking about 0.50 packs per day. She has never used smokeless tobacco. She reports that she does not drink alcohol or use drugs.  Allergies:  Allergies  Allergen Reactions  .  Penicillins Hives    Has patient had a PCN reaction causing immediate rash, facial/tongue/throat swelling, SOB or lightheadedness with hypotension: Yes Has patient had a PCN reaction causing severe rash involving mucus membranes or skin necrosis: No Has patient had a PCN reaction that required hospitalization: Yes Has patient had a PCN reaction occurring within the last 10 years: No If all of the above answers are "NO", then may proceed with Cephalosporin use.      (Not in a hospital admission)   Physical Exam: Blood pressure 98/72, pulse 90, temperature 98.3 F (36.8 C), resp. rate 18, height '5\' 10"'  (1.778 m), weight (!) 181.4 kg, last menstrual period 07/15/2018, SpO2 98 %. General: pleasant, severely obese, black female who is laying in bed in NAD HEENT: head is normocephalic, atraumatic.  Sclera are noninjected.  PERRL.  Ears and nose without any masses or lesions.  Mouth is pink and moist Heart: regular, rate, and rhythm.  Normal s1,s2. No obvious murmurs, gallops, or rubs noted.  Palpable radial and pedal pulses bilaterally Lungs: CTAB, no wheezes, rhonchi, or rales noted.  Respiratory effort nonlabored Abd: soft, mildly tender in LLQ (just had pain meds), severely obese, +BS, no masses, hernias, and organomegaly is unable to palpated due to body habitus MS: all 4 extremities are symmetrical with no cyanosis, clubbing, or  edema. Skin: warm and dry with no masses, lesions, or rashes Psych: A&Ox3 with an appropriate affect.   Results for orders placed or performed during the hospital encounter of 08/07/18 (from the past 48 hour(s))  Urinalysis, Routine w reflex microscopic     Status: Abnormal   Collection Time: 08/07/18 10:27 AM  Result Value Ref Range   Color, Urine YELLOW YELLOW   APPearance CLOUDY (A) CLEAR   Specific Gravity, Urine >1.030 (H) 1.005 - 1.030   pH 6.0 5.0 - 8.0   Glucose, UA NEGATIVE NEGATIVE mg/dL   Hgb urine dipstick NEGATIVE NEGATIVE   Bilirubin Urine  SMALL (A) NEGATIVE   Ketones, ur NEGATIVE NEGATIVE mg/dL   Protein, ur NEGATIVE NEGATIVE mg/dL   Nitrite NEGATIVE NEGATIVE   Leukocytes, UA MODERATE (A) NEGATIVE    Comment: Performed at [facility], [place], [place] [address]  Pregnancy, urine     Status: None   Collection Time: 08/07/18 10:27 AM  Result Value Ref Range   Preg Test, Ur NEGATIVE NEGATIVE    Comment:        THE SENSITIVITY OF THIS METHODOLOGY IS >20 mIU/mL. Performed at [facility], [address] [place], [place] [address]   Urinalysis, Microscopic (reflex)     Status: Abnormal   Collection Time: 08/07/18 10:27 AM  Result Value Ref Range   RBC / HPF 0-5 0 - 5 RBC/hpf   WBC, UA 11-20 0 - 5 WBC/hpf   Bacteria, UA MANY (A) NONE SEEN   Squamous Epithelial / LPF 21-50 0 - 5    Comment: Performed at [facility], [address] [place], [place] [address]  CBC with Differential     Status: Abnormal   Collection Time: 08/07/18 10:53 AM  Result Value Ref Range   WBC 19.9 (H) 4.0 - 10.5 K/uL   RBC 4.59 3.87 - 5.11 MIL/uL   Hemoglobin 10.9 (L) 12.0 - 15.0 g/dL   HCT 35.2 (L) 36.0 - 46.0 %   MCV 76.7 (L) 80.0 - 100.0 fL   MCH 23.7 (L) 26.0 - 34.0 pg   MCHC 31.0 30.0 - 36.0 g/dL   RDW 17.2 (H) 11.5 - 15.5 %   Platelets 308 150 - 400 K/uL   nRBC 0.0 0.0 - 0.2 %   Neutrophils Relative % 76 %   Neutro Abs 15.2 (H) 1.7 - 7.7 K/uL   Lymphocytes Relative 15 %   Lymphs Abs 3.0 0.7 - 4.0 K/uL   Monocytes Relative 7 %   Monocytes Absolute 1.4 (H) 0.1 - 1.0 K/uL   Eosinophils Relative 1 %   Eosinophils Absolute 0.2 0.0 - 0.5 K/uL   Basophils Relative 0 %   Basophils Absolute 0.0 0.0 - 0.1 K/uL   Immature Granulocytes 1 %   Abs Immature Granulocytes 0.11 (H) 0.00 - 0.07 K/uL    Comment: Performed at [facility], [address] [place], [place] [address]  Comprehensive metabolic panel     Status: Abnormal   Collection Time: 08/07/18 10:53 AM  Result Value Ref  Range   Sodium 136 135 - 145 mmol/L   Potassium 3.6 3.5 - 5.1 mmol/L   Chloride 104 98 - 111 mmol/L   CO2 23 22 - 32 mmol/L   Glucose, Bld 124 (H) 70 - 99 mg/dL   BUN 10 6 - 20 mg/dL   Creatinine, Ser 0.72 0.44 - 1.00 mg/dL   Calcium  8.7 (L) 8.9 - 10.3 mg/dL   Total Protein 7.1 6.5 - 8.1 g/dL   Albumin 3.3 (L) 3.5 - 5.0 g/dL   AST 16 15 - 41 U/L   ALT 15 0 - 44 U/L   Alkaline Phosphatase 71 38 - 126 U/L   Total Bilirubin 0.9 0.3 - 1.2 mg/dL   GFR calc non Af Amer >60 >60 mL/min   GFR calc Af Amer >60 >60 mL/min    Comment: (NOTE) The eGFR has been calculated using the CKD EPI equation. This calculation has not been validated in all clinical situations. eGFR's persistently <60 mL/min signify possible Chronic Kidney Disease.    Anion gap 9 5 - 15    Comment: Performed at [facility], [address] [place], [place] [address]  Lipase, blood     Status: None   Collection Time: 08/07/18 10:53 AM  Result Value Ref Range   Lipase 25 11 - 51 U/L    Comment: Performed at [facility], [address] [place], [place] [address]  Wet prep, genital     Status: Abnormal   Collection Time: 08/07/18 11:09 AM  Result Value Ref Range   Yeast Wet Prep HPF POC NONE SEEN NONE SEEN   Trich, Wet Prep NONE SEEN NONE SEEN   Clue Cells Wet Prep HPF POC PRESENT (A) NONE SEEN   WBC, Wet Prep HPF POC MANY (A) NONE SEEN   Sperm NONE SEEN     Comment: Performed at [facility], [address] [place], [place] [address]   Ct Abdomen Pelvis W Contrast  Result Date: 08/07/2018 CLINICAL DATA:  Lower abdomen and pelvic pain. EXAM: CT ABDOMEN AND PELVIS WITH CONTRAST TECHNIQUE: Multidetector CT imaging of the abdomen and pelvis was performed using the standard protocol following bolus administration of intravenous contrast. CONTRAST:  148m ISOVUE-300 IOPAMIDOL (ISOVUE-300) INJECTION 61% COMPARISON:  Patient's prior CT scan from July 22, 2005 is not available on PACs  for comparison. FINDINGS: Lower chest: No acute abnormality. Hepatobiliary: No focal liver abnormality is seen. No gallstones, gallbladder wall thickening, or biliary dilatation. Pancreas: Unremarkable. No pancreatic ductal dilatation or surrounding inflammatory changes. Spleen: Normal in size without focal abnormality. Adrenals/Urinary Tract: Adrenal glands are unremarkable. Kidneys are normal, without renal calculi, focal lesion, or hydronephrosis. Bladder is unremarkable. Stomach/Bowel: There is bowel wall thickening with surrounding inflammation and small foci of micro focal perforation from acute sigmoid diverticulitis. There is no bowel obstruction. The small bowel is normal. The appendix is normal. The stomach is normal. Vascular/Lymphatic: No significant vascular findings are present. No enlarged abdominal or pelvic lymph nodes. Reproductive: Uterus and bilateral adnexa are unremarkable. Other: Small umbilical herniation of mesenteric fat is identified. Musculoskeletal: Minimal degenerative joint changes of the spine are noted. IMPRESSION: Acute sigmoid diverticulitis with small foci of focal micro perforation. No focal discrete abscess is noted. Electronically Signed   By: WAbelardo DieselM.D.   On: 08/07/2018 12:06      Assessment/Plan Diverticulitis with microperforation This is the patient's first episode of diverticulitis.  She does have a foci of air outside of her colon, but no phlegmon or abscess found.  Because of this finding along with her WBC of 19K, we will place the patient on IV merrem for complicated diverticulitis.  She will be treated with bowel rest and abx therapy.  We did discuss the possibility that if she fails conservative management that she may require surgical intervention.  She understands this plan.  I drew a picture of the disease process and explained what this diagnosis meant as she has never heard of diverticulitis before.  We did discuss recommendations following this  that she obtain a colonoscopy.  She understands all of this.  Asthma - albuterol prn OSA HTN - prn antihypertensives Severe obesity FEN - IVFs/NPO x ice and sips of water VTE - SCDs/Lovenox ID - Merrem 10-11 --->  Henreitta Cea, [facility] 08/07/2018, 4:40 PM Pager: [phone]

## 2018-08-07 NOTE — ED Triage Notes (Signed)
PResents with lower abdominal pain and pressure in her vagina. Shstates, "It hurts when I try to have a BM, it hurts when I walk, I feel I have watermelon trying to fal lout of my vagina, my uterus everything. I took Ibuprofen and I when I take Ibuprofen the pain is much better. IT first felt like a lot of gas and I have tried cranberry juice and a laxative" LAst normal BM Tuesday.

## 2018-08-07 NOTE — ED Notes (Signed)
ED Provider at bedside. 

## 2018-08-07 NOTE — ED Notes (Signed)
ED TO INPATIENT HANDOFF REPORT  Name/Age/Gender Morgan Bryant 37 y.o. female  Code Status Code Status History    Date Active Date Inactive Code Status Order ID Comments User Context   06/20/2017 1804 06/24/2017 1733 Full Code [id number]  Bonnielee Haff, MD Inpatient      Home/SNF/Other Home  Chief Complaint abdominal pain  Level of Care/Admitting Diagnosis ED Disposition    ED Disposition Condition [place]: [facility] [100102]  Level of Care: Med-Surg [16]  Diagnosis: Diverticulitis [010932]  Admitting Physician: CCS, [place]  Attending Physician: CCS, MD [[id number]]  Estimated length of stay: 3 - 4 days  Certification:: I certify this patient will need inpatient services for at least 2 midnights  Bed request comments: 5W  PT Class (Do Not Modify): Inpatient [101]  PT Acc Code (Do Not Modify): Private [1]       Medical History Past Medical History:  Diagnosis Date  . Anemia   . Asthma   . Hypertension   . Morbid obesity ([place])   . Sleep apnea     Allergies Allergies  Allergen Reactions  . Penicillins Hives    Has patient had a PCN reaction causing immediate rash, facial/tongue/throat swelling, SOB or lightheadedness with hypotension: Yes Has patient had a PCN reaction causing severe rash involving mucus membranes or skin necrosis: No Has patient had a PCN reaction that required hospitalization: Yes Has patient had a PCN reaction occurring within the last 10 years: No If all of the above answers are "NO", then may proceed with Cephalosporin use.     IV Location/Drains/Wounds Patient Lines/Drains/Airways Status   Active Line/Drains/Airways    Name:   Placement date:   Placement time:   Site:   Days:   Peripheral IV 08/07/18 Left Antecubital   08/07/18    1130    Antecubital   less than 1          Labs/Imaging Results for orders placed or performed during the hospital encounter of 08/07/18 (from the past 48  hour(s))  Urinalysis, Routine w reflex microscopic     Status: Abnormal   Collection Time: 08/07/18 10:27 AM  Result Value Ref Range   Color, Urine YELLOW YELLOW   APPearance CLOUDY (A) CLEAR   Specific Gravity, Urine >1.030 (H) 1.005 - 1.030   pH 6.0 5.0 - 8.0   Glucose, UA NEGATIVE NEGATIVE mg/dL   Hgb urine dipstick NEGATIVE NEGATIVE   Bilirubin Urine SMALL (A) NEGATIVE   Ketones, ur NEGATIVE NEGATIVE mg/dL   Protein, ur NEGATIVE NEGATIVE mg/dL   Nitrite NEGATIVE NEGATIVE   Leukocytes, UA MODERATE (A) NEGATIVE    Comment: Performed at [facility], [place], [place] [address]  Pregnancy, urine     Status: None   Collection Time: 08/07/18 10:27 AM  Result Value Ref Range   Preg Test, Ur NEGATIVE NEGATIVE    Comment:        THE SENSITIVITY OF THIS METHODOLOGY IS >20 mIU/mL. Performed at [facility], [address] [place], [place] [address]   Urinalysis, Microscopic (reflex)     Status: Abnormal   Collection Time: 08/07/18 10:27 AM  Result Value Ref Range   RBC / HPF 0-5 0 - 5 RBC/hpf   WBC, UA 11-20 0 - 5 WBC/hpf   Bacteria, UA MANY (A) NONE SEEN   Squamous Epithelial / LPF 21-50 0 - 5    Comment: Performed at  [facility], [address] [place], [place] [address]  CBC with Differential     Status: Abnormal   Collection Time: 08/07/18 10:53 AM  Result Value Ref Range   WBC 19.9 (H) 4.0 - 10.5 K/uL   RBC 4.59 3.87 - 5.11 MIL/uL   Hemoglobin 10.9 (L) 12.0 - 15.0 g/dL   HCT 35.2 (L) 36.0 - 46.0 %   MCV 76.7 (L) 80.0 - 100.0 fL   MCH 23.7 (L) 26.0 - 34.0 pg   MCHC 31.0 30.0 - 36.0 g/dL   RDW 17.2 (H) 11.5 - 15.5 %   Platelets 308 150 - 400 K/uL   nRBC 0.0 0.0 - 0.2 %   Neutrophils Relative % 76 %   Neutro Abs 15.2 (H) 1.7 - 7.7 K/uL   Lymphocytes Relative 15 %   Lymphs Abs 3.0 0.7 - 4.0 K/uL   Monocytes Relative 7 %   Monocytes Absolute 1.4 (H) 0.1 - 1.0 K/uL   Eosinophils Relative 1 %   Eosinophils Absolute 0.2  0.0 - 0.5 K/uL   Basophils Relative 0 %   Basophils Absolute 0.0 0.0 - 0.1 K/uL   Immature Granulocytes 1 %   Abs Immature Granulocytes 0.11 (H) 0.00 - 0.07 K/uL    Comment: Performed at [facility], [address] [place], [place] [address]  Comprehensive metabolic panel     Status: Abnormal   Collection Time: 08/07/18 10:53 AM  Result Value Ref Range   Sodium 136 135 - 145 mmol/L   Potassium 3.6 3.5 - 5.1 mmol/L   Chloride 104 98 - 111 mmol/L   CO2 23 22 - 32 mmol/L   Glucose, Bld 124 (H) 70 - 99 mg/dL   BUN 10 6 - 20 mg/dL   Creatinine, Ser 0.72 0.44 - 1.00 mg/dL   Calcium 8.7 (L) 8.9 - 10.3 mg/dL   Total Protein 7.1 6.5 - 8.1 g/dL   Albumin 3.3 (L) 3.5 - 5.0 g/dL   AST 16 15 - 41 U/L   ALT 15 0 - 44 U/L   Alkaline Phosphatase 71 38 - 126 U/L   Total Bilirubin 0.9 0.3 - 1.2 mg/dL   GFR calc non Af Amer >60 >60 mL/min   GFR calc Af Amer >60 >60 mL/min    Comment: (NOTE) The eGFR has been calculated using the CKD EPI equation. This calculation has not been validated in all clinical situations. eGFR's persistently <60 mL/min signify possible Chronic Kidney Disease.    Anion gap 9 5 - 15    Comment: Performed at [facility], [address] [place], [place] [address]  Lipase, blood     Status: None   Collection Time: 08/07/18 10:53 AM  Result Value Ref Range   Lipase 25 11 - 51 U/L    Comment: Performed at [facility], [address] [place], [place] [address]  Wet prep, genital     Status: Abnormal   Collection Time: 08/07/18 11:09 AM  Result Value Ref Range   Yeast Wet Prep HPF POC NONE SEEN NONE SEEN   Trich, Wet Prep NONE SEEN NONE SEEN   Clue Cells Wet Prep HPF POC PRESENT (A) NONE SEEN   WBC, Wet Prep HPF POC MANY (A) NONE SEEN   Sperm NONE SEEN     Comment: Performed at [facility], [address] [place], [place] [address]   Ct Abdomen Pelvis W Contrast  Result Date: 08/07/2018 CLINICAL DATA:  Lower abdomen  and pelvic pain. EXAM: CT ABDOMEN AND PELVIS WITH CONTRAST TECHNIQUE: Multidetector CT imaging of the abdomen and pelvis was performed using the standard protocol following bolus administration of intravenous contrast. CONTRAST:  170m ISOVUE-300 IOPAMIDOL (ISOVUE-300) INJECTION 61% COMPARISON:  Patient's prior CT scan from July 22, 2005 is not available on PACs for comparison. FINDINGS: Lower chest: No acute abnormality. Hepatobiliary: No focal liver abnormality is seen. No gallstones, gallbladder wall thickening, or biliary dilatation. Pancreas: Unremarkable. No pancreatic ductal dilatation or surrounding inflammatory changes. Spleen: Normal in size without focal abnormality. Adrenals/Urinary Tract: Adrenal glands are unremarkable. Kidneys are normal, without renal calculi, focal lesion, or hydronephrosis. Bladder is unremarkable. Stomach/Bowel: There is bowel wall thickening with surrounding inflammation and small foci of micro focal perforation from acute sigmoid diverticulitis. There is no bowel obstruction. The small bowel is normal. The appendix is normal. The stomach is normal. Vascular/Lymphatic: No significant vascular findings are present. No enlarged abdominal or pelvic lymph nodes. Reproductive: Uterus and bilateral adnexa are unremarkable. Other: Small umbilical herniation of mesenteric fat is identified. Musculoskeletal: Minimal degenerative joint changes of the spine are noted. IMPRESSION: Acute sigmoid diverticulitis with small foci of focal micro perforation. No focal discrete abscess is noted. Electronically Signed   By: WAbelardo DieselM.D.   On: 08/07/2018 12:06   None  Pending Labs Unresulted Labs (From admission, onward)    Start     Ordered   08/07/18 1151  HIV Antibody (routine testing w rflx)  Once,   R     08/07/18 1150   08/07/18 1151  RPR  Once,   STAT     08/07/18 1151   08/07/18 1127  Urine culture  STAT,   STAT     08/07/18 1126   Signed and Held  Creatinine, serum   (enoxaparin (LOVENOX)    CrCl >/= 30 ml/min)  Weekly,   R    Comments:  while on enoxaparin therapy    Signed and Held   Signed and Held  Basic metabolic panel  Tomorrow morning,   R     Signed and Held   Signed and Held  CBC  Tomorrow morning,   R     Signed and Held          Vitals/Pain Today's Vitals   08/07/18 1426 08/07/18 1426 08/07/18 1530 08/07/18 1605  BP: 105/68   98/72  Pulse: 84   90  Resp: 18   18  Temp: 98.3 F (36.8 C)     TempSrc:      SpO2: 100%   98%  Weight:      Height:      PainSc: '3  3  3      ' Isolation Precautions No active isolations  Medications Medications  ceFEPIme (MAXIPIME) 2 g injection (has no administration in time range)  ibuprofen (ADVIL,MOTRIN) tablet 800 mg (800 mg Oral Given 08/07/18 1126)  cefTRIAXone (ROCEPHIN) injection 250 mg (250 mg Intramuscular Given 08/07/18 1127)  azithromycin (ZITHROMAX) tablet 1,000 mg (1,000 mg Oral Given 08/07/18 1126)  iopamidol (ISOVUE-300) 61 % injection 100 mL (100 mLs Intravenous Contrast Given 08/07/18 1144)  sodium chloride 0.9 % bolus 1,000 mL (0 mLs Intravenous Stopped 08/07/18 1355)  morphine 4 MG/ML injection 4 mg (4 mg Intravenous Given 08/07/18 1307)  ceFEPIme (MAXIPIME) 2 g in sodium chloride 0.9 % 100 mL IVPB ( Intravenous Stopped 08/07/18 1353)    And  metroNIDAZOLE (FLAGYL) IVPB 500 mg (  Intravenous Stopped 08/07/18 1456)    Mobility walks

## 2018-08-07 NOTE — ED Provider Notes (Signed)
Patient arrives from our affiliated facility and spoke with her surgical team soon afterwards, and she was evaluated within few minutes of arriving to this facility. Patient is awake and alert.   Gerhard Munch, MD 08/07/18 [phone]

## 2018-08-07 NOTE — Progress Notes (Signed)
Pharmacy Antibiotic Note  Morgan Bryant is a 37 y.o. female admitted on 08/07/2018 with intra-abdominal infection.  Pharmacy has been consulted for merrem dosing.  Plan: merrem 2gm IV q8h Follow renal function and clinical course  Height: 5\' 10"  (177.8 cm) Weight: (!) 400 lb (181.4 kg) IBW/kg (Calculated) : 68.5  Temp (24hrs), Avg:98.7 F (37.1 C), Min:98.3 F (36.8 C), Max:99.2 F (37.3 C)  Recent Labs  Lab 08/07/18 1053  WBC 19.9*  CREATININE 0.72    Estimated Creatinine Clearance: 174.5 mL/min (by C-G formula based on SCr of 0.72 mg/dL).    Allergies  Allergen Reactions  . Penicillins Hives    Has patient had a PCN reaction causing immediate rash, facial/tongue/throat swelling, SOB or lightheadedness with hypotension: Yes Has patient had a PCN reaction causing severe rash involving mucus membranes or skin necrosis: No Has patient had a PCN reaction that required hospitalization: Yes Has patient had a PCN reaction occurring within the last 10 years: No If all of the above answers are "NO", then may proceed with Cephalosporin use.     Antimicrobials this admission: 10/11 azithromycin po x 1 10/11 CTX x 1 10/11 cefepime x 1 10/11 flagyl x1 10/11 merrem >>   Dose adjustments this admission:   Microbiology results:  10/11 UCx:    Thank you for allowing pharmacy to be a part of this patient's care.  Arley Phenix RPh 08/07/2018, 5:41 PM Pager [phone]

## 2018-08-07 NOTE — ED Provider Notes (Addendum)
[facility] EMERGENCY DEPARTMENT Provider Note   CSN: [id number] Arrival date & time: 08/07/18  1008     History   Chief Complaint Chief Complaint  Patient presents with  . Abdominal Pain    HPI Morgan Bryant is a 37 y.o. female.  HPI   Pt is a 37 y/o female with a h/o anemia, asthma, HTN, morbid obesity, sleep apnea, who presents to the ED today c/o lower abd pain the began 3 days ago. States that pain has been waxing and waning. Rates pain "12"/10. Describes pain as a heaviness. States pain is worse when trying to have a BM, cough, or laugh.  Pain improves after taking 800mg  ibuprofen.  Reports hematuria. Pt denies constipation, diarrhea, nausea, vomiting, fevers, dysuria, frequency, vaginal bleeding, or vaginal discharge. LMP 07/15/18. Pt states she is sexually active in a monogamous relationship. She has some concerns for STD as she reports having unprotected intercourse with this person. She reports a lesion to the left labial that she states has been draining purulent fluid.  Past Medical History:  Diagnosis Date  . Anemia   . Asthma   . Hypertension   . Morbid obesity (HCC)   . Sleep apnea     Patient Active Problem List   Diagnosis Date Noted  . Asthma exacerbation 06/20/2017  . OSA (obstructive sleep apnea) 06/20/2017  . Essential hypertension 06/20/2017  . Morbid obesity (HCC) 06/20/2017  . Acute asthma exacerbation 06/20/2017    Past Surgical History:  Procedure Laterality Date  . CESAREAN SECTION       OB History   None     Home Medications    Prior to Admission medications   Medication Sig Start Date End Date Taking? Authorizing Provider  albuterol (PROVENTIL) (2.5 MG/3ML) 0.083% nebulizer solution Take 3 mLs (2.5 mg total) by nebulization every 6 (six) hours as needed for wheezing or shortness of breath. 06/24/17   Osvaldo Shipper, MD  amLODipine (NORVASC) 5 MG tablet Take 5 mg by mouth daily. 03/19/17 03/19/18  [provider]    docusate sodium (COLACE) 50 MG capsule Take 2 capsules (100 mg total) by mouth 2 (two) times daily. 06/24/17   Osvaldo Shipper, MD  ferrous sulfate 325 (65 FE) MG tablet Take 1 tablet (325 mg total) by mouth 2 (two) times daily with a meal. 06/24/17   Osvaldo Shipper, MD  fluticasone (FLOVENT DISKUS) 50 MCG/BLIST diskus inhaler Inhale 2 puffs into the lungs 2 (two) times daily. 06/24/17   Osvaldo Shipper, MD  lisinopril (PRINIVIL,ZESTRIL) 20 MG tablet Take 20 mg by mouth daily.    [provider]  nicotine (NICODERM CQ - DOSED IN MG/24 HOURS) 14 mg/24hr patch Place 1 patch (14 mg total) onto the skin daily. 06/25/17   Osvaldo Shipper, MD  predniSONE (DELTASONE) 20 MG tablet Take 3 tablets daily for 4 days, then take 2 tablets daily for 4 days, then take 1 tablet daily for 4 days, then STOP. 06/24/17   Osvaldo Shipper, MD    Family History Family History  Problem Relation Age of Onset  . Hypertension Mother   . Cancer Father     Social History Social History   Tobacco Use  . Smoking status: Current Every Day Smoker    Packs/day: 0.50    Types: Cigarettes  . Smokeless tobacco: Never Used  Substance Use Topics  . Alcohol use: No  . Drug use: No     Allergies   Penicillins   Review of Systems Review  of Systems  Constitutional: Negative for chills and fever.  HENT: Negative for sore throat.   Eyes: Negative for visual disturbance.  Respiratory: Negative for cough and shortness of breath.   Cardiovascular: Negative for chest pain.  Gastrointestinal: Positive for abdominal pain. Negative for constipation, diarrhea, nausea and vomiting.  Genitourinary: Positive for hematuria and pelvic pain. Negative for dysuria, frequency, vaginal bleeding and vaginal discharge.  Musculoskeletal: Negative for back pain.  Skin: Negative for rash.  Neurological: Negative for headaches.     Physical Exam Updated Vital Signs BP 128/72   Pulse (!) 102   Temp 99.2 F (37.3 C) (Oral)    Resp 18   Ht 5\' 10"  (1.778 m)   Wt (!) 181.4 kg   LMP 07/15/2018 (Exact Date)   SpO2 100%   BMI 57.39 kg/m   Physical Exam  Constitutional: She appears well-developed and well-nourished.  Non-toxic appearance. No distress.  HENT:  Head: Normocephalic and atraumatic.  Mouth/Throat: Oropharynx is clear and moist.  Eyes: Conjunctivae are normal.  Neck: Neck supple.  Cardiovascular: Normal rate, regular rhythm and normal heart sounds.  No murmur heard. Pulmonary/Chest: Effort normal and breath sounds normal. No respiratory distress.  Abdominal: Soft. There is no CVA tenderness.  BS present in all 4 quadrants. Suprapubic, RUQ, epigastric, LLQ and LUQ TTP. No rigidity or rebound. Mild voluntary guarding.  Genitourinary:  Genitourinary Comments: Exam performed by Karrie Meres,  exam chaperoned Date: 08/07/2018 VULVA: 1 cm superficial tender lesion to the left upper labia, no fluctuance noted and no obvious drainage noted. No surrounding erythema or significant induration VAGINA: normal appearing vagina with normal color and discharge CERVIX: cervix not fully visualized secondary to body habitus, yellow discharge present in vaginal vault, cervical motion tenderness present, Wet prep and DNA probe for chlamydia and GC obtained.   ADNEXA: normal adnexa in size, nontender and no masses UTERUS: uterus is normal size, shape, consistency and nontender.   Musculoskeletal: She exhibits no edema.  Neurological: She is alert.  Skin: Skin is warm and dry.  Psychiatric: She has a normal mood and affect.  Nursing note and vitals reviewed.   ED Treatments / Results  Labs (all labs ordered are listed, but only abnormal results are displayed) Labs Reviewed  WET PREP, GENITAL - Abnormal; Notable for the following components:      Result Value   Clue Cells Wet Prep HPF POC PRESENT (*)    WBC, Wet Prep HPF POC MANY (*)    All other components within normal limits  URINALYSIS, ROUTINE W REFLEX  MICROSCOPIC - Abnormal; Notable for the following components:   APPearance CLOUDY (*)    Specific Gravity, Urine >1.030 (*)    Bilirubin Urine SMALL (*)    Leukocytes, UA MODERATE (*)    All other components within normal limits  CBC WITH DIFFERENTIAL/PLATELET - Abnormal; Notable for the following components:   WBC 19.9 (*)    Hemoglobin 10.9 (*)    HCT 35.2 (*)    MCV 76.7 (*)    MCH 23.7 (*)    RDW 17.2 (*)    Neutro Abs 15.2 (*)    Monocytes Absolute 1.4 (*)    Abs Immature Granulocytes 0.11 (*)    All other components within normal limits  COMPREHENSIVE METABOLIC PANEL - Abnormal; Notable for the following components:   Glucose, Bld 124 (*)    Calcium 8.7 (*)    Albumin 3.3 (*)    All other components within normal limits  URINALYSIS, MICROSCOPIC (REFLEX) - Abnormal; Notable for the following components:   Bacteria, UA MANY (*)    All other components within normal limits  URINE CULTURE  PREGNANCY, URINE  LIPASE, BLOOD  HIV ANTIBODY (ROUTINE TESTING W REFLEX)  RPR  GC/CHLAMYDIA PROBE AMP ([place]) NOT AT [facility]    EKG None  Radiology Ct Abdomen Pelvis W Contrast  Result Date: 08/07/2018 CLINICAL DATA:  Lower abdomen and pelvic pain. EXAM: CT ABDOMEN AND PELVIS WITH CONTRAST TECHNIQUE: Multidetector CT imaging of the abdomen and pelvis was performed using the standard protocol following bolus administration of intravenous contrast. CONTRAST:  ISOVUE-300 IOPAMIDOL (ISOVUE-300) INJECTION 61% COMPARISON:  Patient's prior CT scan from July 22, 2005 is not available on PACs for comparison. FINDINGS: Lower chest: No acute abnormality. Hepatobiliary: No focal liver abnormality is seen. No gallstones, gallbladder wall thickening, or biliary dilatation. Pancreas: Unremarkable. No pancreatic ductal dilatation or surrounding inflammatory changes. Spleen: Normal in size without focal abnormality. Adrenals/Urinary Tract: Adrenal glands are unremarkable. Kidneys are normal,  without renal calculi, focal lesion, or hydronephrosis. Bladder is unremarkable. Stomach/Bowel: There is bowel wall thickening with surrounding inflammation and small foci of micro focal perforation from acute sigmoid diverticulitis. There is no bowel obstruction. The small bowel is normal. The appendix is normal. The stomach is normal. Vascular/Lymphatic: No significant vascular findings are present. No enlarged abdominal or pelvic lymph nodes. Reproductive: Uterus and bilateral adnexa are unremarkable. Other: Small umbilical herniation of mesenteric fat is identified. Musculoskeletal: Minimal degenerative joint changes of the spine are noted. IMPRESSION: Acute sigmoid diverticulitis with small foci of focal micro perforation. No focal discrete abscess is noted. Electronically Signed   By: Sherian Rein M.D.   On: 08/07/2018 12:06    Procedures Procedures (including critical care time)  Medications Ordered in ED Medications  ceFEPIme (MAXIPIME) 2 g in sodium chloride 0.9 % 100 mL IVPB (2 g Intravenous New Bag/Given 08/07/18 1312)    And  metroNIDAZOLE (FLAGYL) IVPB 500 mg (has no administration in time range)  ceFEPIme (MAXIPIME) 2 g injection (has no administration in time range)  ibuprofen (ADVIL,MOTRIN) tablet 800 mg (800 mg Oral Given 08/07/18 1126)  cefTRIAXone (ROCEPHIN) injection 250 mg (250 mg Intramuscular Given 08/07/18 1127)  azithromycin (ZITHROMAX) tablet 1,000 mg (1,000 mg Oral Given 08/07/18 1126)  iopamidol (ISOVUE-300) 61 % injection 100 mL (100 mLs Intravenous Contrast Given 08/07/18 1144)  sodium chloride 0.9 % bolus 1,000 mL (1,000 mLs Intravenous New Bag/Given 08/07/18 1235)  morphine 4 MG/ML injection 4 mg (4 mg Intravenous Given 08/07/18 1307)     Initial Impression / Assessment and Plan / ED Course  I have reviewed the triage vital signs and the nursing notes.  Pertinent labs & imaging results that were available during my care of the patient were reviewed by me and  considered in my medical decision making (see chart for details).      Final Clinical Impressions(s) / ED Diagnoses   Final diagnoses:  Perforation of sigmoid colon due to diverticulitis  PID (acute pelvic inflammatory disease)   Pt presenting with lower abd pain and concern for STD. Initially tachycardic to 116, improved somewhat after pain meds and fluids. Afebrile. Otherwise normal VS.   Has abd TTP on exam but not surgical abdomen at this time. Pelvic exam is suggestive of PID. She has cervical motion tenderness and a moderate amount of discharge on exam. She also has a superficial lesion that was swabbed to test for HSV.   Labs with  elevated WBC count to 19.9, it does appear that pts WBC is typically elevated in this range and the significance of this is difficult to interpret. She shas a mild, stable anemia as well. CMP WNL. Lipase WNL. Pregnancy test is negative and therefore no evidence of ectopic. Wet prep with clue cells and WBC. Gc/chlamydia, hiv, rpr obtained. prophylactically treated given discharge on exam and suspicion for PID. Will add flagyl for suspected BV.   UA appears contaminated. Will send culture.  CT abd pelvis ordered given abd TTP and abnormal labs with showed acute sigmoid diverticulitis with mircroperforation. No abscess identified.  12:45 PM CONSULT with Dr. Andrey Campanile with general surgery who recommended transferring the pt to the ED and they will consult on the pt in the ED. He recommended starting zosyn. On reviewing allergies pt has PCN allergy. Will start cefepime and flagyl given her allergy.   12:56 PM Discussed case with Dr. Jeraldine Loots who accepts the pt for transfer to [facility] Emergency Department.   ED Discharge Orders    None       Karrie Meres, PA-C 08/07/18 [address] Ember Henrikson S, PA-C 08/07/18 1322    Geoffery Lyons, MD 08/07/18 1430

## 2018-08-07 NOTE — ED Notes (Signed)
[facility] here to transport pt.   

## 2018-08-07 NOTE — Progress Notes (Signed)
Brief pharmacy VTE prophylaxis note:  Scr 0.72, CrCl > 163mls/min Enoxaparin increased to 0.5mg /kg (90mg ) SQ Q24h for BMI >30  Arley Phenix RPh 08/07/2018, 5:33 PM Pager [phone]

## 2018-08-08 LAB — CBC
HCT: 33.5 % — ABNORMAL LOW (ref 36.0–46.0)
Hemoglobin: 10.3 g/dL — ABNORMAL LOW (ref 12.0–15.0)
MCH: 23.2 pg — ABNORMAL LOW (ref 26.0–34.0)
MCHC: 30.7 g/dL (ref 30.0–36.0)
MCV: 75.5 fL — ABNORMAL LOW (ref 80.0–100.0)
PLATELETS: 298 10*3/uL (ref 150–400)
RBC: 4.44 MIL/uL (ref 3.87–5.11)
RDW: 16.9 % — AB (ref 11.5–15.5)
WBC: 18.7 10*3/uL — AB (ref 4.0–10.5)
nRBC: 0 % (ref 0.0–0.2)

## 2018-08-08 LAB — BASIC METABOLIC PANEL
ANION GAP: 9 (ref 5–15)
BUN: 8 mg/dL (ref 6–20)
CALCIUM: 8.4 mg/dL — AB (ref 8.9–10.3)
CO2: 24 mmol/L (ref 22–32)
CREATININE: 0.66 mg/dL (ref 0.44–1.00)
Chloride: 105 mmol/L (ref 98–111)
Glucose, Bld: 92 mg/dL (ref 70–99)
Potassium: 4.1 mmol/L (ref 3.5–5.1)
Sodium: 138 mmol/L (ref 135–145)

## 2018-08-08 LAB — URINE CULTURE

## 2018-08-08 LAB — HIV ANTIBODY (ROUTINE TESTING W REFLEX): HIV SCREEN 4TH GENERATION: NONREACTIVE

## 2018-08-08 LAB — SYPHILIS: RPR W/REFLEX TO RPR TITER AND TREPONEMAL ANTIBODIES, TRADITIONAL SCREENING AND DIAGNOSIS ALGORITHM: RPR Ser Ql: NONREACTIVE

## 2018-08-08 NOTE — Progress Notes (Signed)
Subjective No acute events. Feeling slightly better today than yesterday. Still with some LLQ discomfort. Denies pain in right abdomen.  Objective: Vital signs in last 24 hours: Temp:  [98.3 F (36.8 C)-99.2 F (37.3 C)] 99 F (37.2 C) (10/12 0552) Pulse Rate:  [84-116] 98 (10/12 0552) Resp:  [18] 18 (10/12 0552) BP: (98-150)/(64-112) 149/84 (10/12 0552) SpO2:  [89 %-100 %] 100 % (10/12 0552) Weight:  [181.4 kg] 181.4 kg (10/11 1024) Last BM Date: 08/04/18  Intake/Output from previous day: 10/11 0701 - 10/12 0700 In: 2473.5 [I.V.:1073.6; IV Piggyback:1399.9] Out: -  Intake/Output this shift: No intake/output data recorded.  Gen: NAD, comfortable CV: RRR Pulm: Normal work of breathing Abd: Obese, soft, somewhat tender to deep palpation LLQ; no tenderness in right abdomen. No rebound. No guarding Ext: SCDs in place  Lab Results: CBC  Recent Labs    08/07/18 1053 08/08/18 0435  WBC 19.9* 18.7*  HGB 10.9* 10.3*  HCT 35.2* 33.5*  PLT 308 298   BMET Recent Labs    08/07/18 1053 08/08/18 0435  NA 136 138  K 3.6 4.1  CL 104 105  CO2 23 24  GLUCOSE 124* 92  BUN 10 8  CREATININE 0.72 0.66  CALCIUM 8.7* 8.4*   PT/INR No results for input(s): LABPROT, INR in the last 72 hours. ABG No results for input(s): PHART, HCO3 in the last 72 hours.  Invalid input(s): PCO2, PO2  Studies/Results:  Anti-infectives: Anti-infectives (From admission, onward)   Start     Dose/Rate Route Frequency Ordered Stop   08/07/18 2200  meropenem (MERREM) 2 g in sodium chloride 0.9 % 100 mL IVPB     2 g 200 mL/hr over 30 Minutes Intravenous Every 8 hours 08/07/18 1744     08/07/18 1304  ceFEPIme (MAXIPIME) 2 g injection    Note to Pharmacy:  Estrella Deeds   : cabinet override      08/07/18 1304 08/08/18 0114   08/07/18 1300  ceFEPIme (MAXIPIME) 2 g in sodium chloride 0.9 % 100 mL IVPB     2 g 200 mL/hr over 30 Minutes Intravenous  Once 08/07/18 1254 08/07/18 1353   08/07/18 1300   metroNIDAZOLE (FLAGYL) IVPB 500 mg     500 mg 100 mL/hr over 60 Minutes Intravenous  Once 08/07/18 1254 08/07/18 1456   08/07/18 1115  cefTRIAXone (ROCEPHIN) injection 250 mg     250 mg Intramuscular  Once 08/07/18 1110 08/07/18 1127   08/07/18 1115  azithromycin (ZITHROMAX) tablet 1,000 mg     1,000 mg Oral  Once 08/07/18 1110 08/07/18 1126       Assessment/Plan: Patient Active Problem List   Diagnosis Date Noted  . Diverticulitis 08/07/2018  . Asthma exacerbation 06/20/2017  . OSA (obstructive sleep apnea) 06/20/2017  . Essential hypertension 06/20/2017  . Morbid obesity (HCC) 06/20/2017  . Acute asthma exacerbation 06/20/2017   Ms. Gudgel is a very pleasant 37yoF with diverticulitis and associated microperforation  -AF, wbc 18.7 from 19.9 -Continue broad spec IV abx -Continue bowel rest -MIVF -PPx: Lovenox 90mg  Q24hrs; SCDs   LOS: 1 day   Stephanie Coup. Cliffton Asters, M.D. [facility]

## 2018-08-09 LAB — CBC WITH DIFFERENTIAL/PLATELET
Abs Immature Granulocytes: 0.1 10*3/uL — ABNORMAL HIGH (ref 0.00–0.07)
Basophils Absolute: 0 10*3/uL (ref 0.0–0.1)
Basophils Relative: 0 %
EOS ABS: 0.1 10*3/uL (ref 0.0–0.5)
EOS PCT: 1 %
HCT: 33.6 % — ABNORMAL LOW (ref 36.0–46.0)
Hemoglobin: 10.1 g/dL — ABNORMAL LOW (ref 12.0–15.0)
IMMATURE GRANULOCYTES: 1 %
LYMPHS ABS: 3 10*3/uL (ref 0.7–4.0)
Lymphocytes Relative: 17 %
MCH: 23.2 pg — ABNORMAL LOW (ref 26.0–34.0)
MCHC: 30.1 g/dL (ref 30.0–36.0)
MCV: 77.2 fL — ABNORMAL LOW (ref 80.0–100.0)
Monocytes Absolute: 1.3 10*3/uL — ABNORMAL HIGH (ref 0.1–1.0)
Monocytes Relative: 8 %
NEUTROS PCT: 73 %
NRBC: 0 % (ref 0.0–0.2)
Neutro Abs: 12.9 10*3/uL — ABNORMAL HIGH (ref 1.7–7.7)
PLATELETS: 332 10*3/uL (ref 150–400)
RBC: 4.35 MIL/uL (ref 3.87–5.11)
RDW: 17 % — AB (ref 11.5–15.5)
WBC: 17.5 10*3/uL — AB (ref 4.0–10.5)

## 2018-08-09 LAB — BASIC METABOLIC PANEL
ANION GAP: 8 (ref 5–15)
BUN: 5 mg/dL — ABNORMAL LOW (ref 6–20)
CO2: 25 mmol/L (ref 22–32)
CREATININE: 0.64 mg/dL (ref 0.44–1.00)
Calcium: 8.8 mg/dL — ABNORMAL LOW (ref 8.9–10.3)
Chloride: 107 mmol/L (ref 98–111)
GFR calc Af Amer: >60 mL/min (ref >60)
Glucose, Bld: 97 mg/dL (ref 70–99)
Potassium: 4 mmol/L (ref 3.5–5.1)
SODIUM: 140 mmol/L (ref 135–145)

## 2018-08-09 NOTE — Progress Notes (Signed)
Subjective No acute events. Feeling better today than yesterday. Still with some LLQ/hypogastric discomfort. Denies elsewhere in abdomen. Denies n/v. Hungry.  Objective: Vital signs in last 24 hours: Temp:  [99.1 F (37.3 C)-101 F (38.3 C)] 99.9 F (37.7 C) (10/13 0437) Pulse Rate:  [94-106] 99 (10/13 0437) Resp:  [16-18] 16 (10/13 0437) BP: (135-152)/(74-84) 143/84 (10/13 0437) SpO2:  [96 %-100 %] 100 % (10/13 0437) Last BM Date: 08/04/18  Intake/Output from previous day: 10/12 0701 - 10/13 0700 In: 2960.7 [I.V.:2745.2; IV Piggyback:215.5] Out: 1350 [Urine:1350] Intake/Output this shift: No intake/output data recorded.  Gen: NAD, comfortable CV: RRR Pulm: Normal work of breathing Abd: Obese, soft, somewhat tender to deep palpation hypogastrium/suprapubic; no tenderness in right abdomen. No rebound. No guarding Ext: SCDs in place  Lab Results: CBC  Recent Labs    08/08/18 0435 08/09/18 0818  WBC 18.7* 17.5*  HGB 10.3* 10.1*  HCT 33.5* 33.6*  PLT 298 332   BMET Recent Labs    08/07/18 1053 08/08/18 0435  NA 136 138  K 3.6 4.1  CL 104 105  CO2 23 24  GLUCOSE 124* 92  BUN 10 8  CREATININE 0.72 0.66  CALCIUM 8.7* 8.4*   PT/INR No results for input(s): LABPROT, INR in the last 72 hours. ABG No results for input(s): PHART, HCO3 in the last 72 hours.  Invalid input(s): PCO2, PO2  Studies/Results:  Anti-infectives: Anti-infectives (From admission, onward)   Start     Dose/Rate Route Frequency Ordered Stop   08/07/18 2200  meropenem (MERREM) 2 g in sodium chloride 0.9 % 100 mL IVPB     2 g 200 mL/hr over 30 Minutes Intravenous Every 8 hours 08/07/18 1744     08/07/18 1304  ceFEPIme (MAXIPIME) 2 g injection    Note to Pharmacy:  Estrella Deeds   : cabinet override      08/07/18 1304 08/08/18 0114   08/07/18 1300  ceFEPIme (MAXIPIME) 2 g in sodium chloride 0.9 % 100 mL IVPB     2 g 200 mL/hr over 30 Minutes Intravenous  Once 08/07/18 1254 08/07/18 1353   08/07/18 1300  metroNIDAZOLE (FLAGYL) IVPB 500 mg     500 mg 100 mL/hr over 60 Minutes Intravenous  Once 08/07/18 1254 08/07/18 1456   08/07/18 1115  cefTRIAXone (ROCEPHIN) injection 250 mg     250 mg Intramuscular  Once 08/07/18 1110 08/07/18 1127   08/07/18 1115  azithromycin (ZITHROMAX) tablet 1,000 mg     1,000 mg Oral  Once 08/07/18 1110 08/07/18 1126       Assessment/Plan: Patient Active Problem List   Diagnosis Date Noted  . Diverticulitis 08/07/2018  . Asthma exacerbation 06/20/2017  . OSA (obstructive sleep apnea) 06/20/2017  . Essential hypertension 06/20/2017  . Morbid obesity (HCC) 06/20/2017  . Acute asthma exacerbation 06/20/2017   Ms. Neary is a very pleasant 37yoF with diverticulitis and associated microperforation  -AF, wbc continues to downtrend -Continue broad spec IV abx -Sips of liquids today -MIVF -PPx: Lovenox 90mg  Q24hrs; SCDs   LOS: 2 days   Stephanie Coup. Cliffton Asters, M.D. [facility]

## 2018-08-10 LAB — BASIC METABOLIC PANEL
Anion gap: 11 (ref 5–15)
BUN: 5 mg/dL — AB (ref 6–20)
CALCIUM: 8.8 mg/dL — AB (ref 8.9–10.3)
CO2: 23 mmol/L (ref 22–32)
Chloride: 104 mmol/L (ref 98–111)
Creatinine, Ser: 0.56 mg/dL (ref 0.44–1.00)
GFR calc Af Amer: >60 mL/min (ref >60)
GLUCOSE: 90 mg/dL (ref 70–99)
Potassium: 4.1 mmol/L (ref 3.5–5.1)
Sodium: 138 mmol/L (ref 135–145)

## 2018-08-10 LAB — CBC WITH DIFFERENTIAL/PLATELET
Abs Immature Granulocytes: 0.07 10*3/uL (ref 0.00–0.07)
Basophils Absolute: 0 10*3/uL (ref 0.0–0.1)
Basophils Relative: 0 %
EOS ABS: 0.2 10*3/uL (ref 0.0–0.5)
EOS PCT: 1 %
HCT: 33.3 % — ABNORMAL LOW (ref 36.0–46.0)
Hemoglobin: 10 g/dL — ABNORMAL LOW (ref 12.0–15.0)
Immature Granulocytes: 0 %
Lymphocytes Relative: 20 %
Lymphs Abs: 3.2 10*3/uL (ref 0.7–4.0)
MCH: 23 pg — AB (ref 26.0–34.0)
MCHC: 30 g/dL (ref 30.0–36.0)
MCV: 76.7 fL — AB (ref 80.0–100.0)
MONO ABS: 1.2 10*3/uL — AB (ref 0.1–1.0)
MONOS PCT: 8 %
NEUTROS PCT: 71 %
Neutro Abs: 11.3 10*3/uL — ABNORMAL HIGH (ref 1.7–7.7)
PLATELETS: 329 10*3/uL (ref 150–400)
RBC: 4.34 MIL/uL (ref 3.87–5.11)
RDW: 16.7 % — AB (ref 11.5–15.5)
WBC: 16 10*3/uL — ABNORMAL HIGH (ref 4.0–10.5)
nRBC: 0 % (ref 0.0–0.2)

## 2018-08-10 LAB — GC/CHLAMYDIA PROBE AMP (~~LOC~~) NOT AT ARMC
Chlamydia: NEGATIVE
Neisseria Gonorrhea: NEGATIVE

## 2018-08-10 MED ORDER — METHOCARBAMOL 500 MG PO TABS: 1000.0000 mg | ORAL_TABLET | Freq: Four times a day (QID) | ORAL | Status: DC | PRN

## 2018-08-10 MED ORDER — ACETAMINOPHEN 500 MG PO TABS
1000.0000 mg | ORAL_TABLET | Freq: Three times a day (TID) | ORAL | Status: DC
  Administered 2018-08-10 – 2018-08-12 (×6): 1000 mg via ORAL
  Filled 2018-08-10 (×7): qty 2

## 2018-08-10 MED ORDER — IBUPROFEN 200 MG PO TABS
600.0000 mg | ORAL_TABLET | Freq: Three times a day (TID) | ORAL | Status: DC
  Administered 2018-08-10 – 2018-08-12 (×6): 600 mg via ORAL
  Filled 2018-08-10 (×7): qty 3

## 2018-08-10 MED ORDER — HYDRALAZINE HCL 20 MG/ML IJ SOLN: 10.0000 mg | INTRAMUSCULAR | Status: DC | PRN

## 2018-08-10 NOTE — Progress Notes (Signed)
Pharmacy Antibiotic Note  Morgan Bryant is a 37 y.o. female admitted on 08/07/2018 with intra-abdominal infection.  Pharmacy has been consulted for merrem dosing. 08/10/2018 Full day Abx #3, WBC 15, AF, Scr WNL  Plan: Continue merrem 2gm IV q8h Follow renal function and clinical course  Height: 5\' 10"  (177.8 cm) Weight: (!) 400 lb (181.4 kg) IBW/kg (Calculated) : 68.5  Temp (24hrs), Avg:99.2 F (37.3 C), Min:99 F (37.2 C), Max:99.3 F (37.4 C)  Recent Labs  Lab 08/07/18 1053 08/08/18 0435 08/09/18 0818 08/10/18 0429  WBC 19.9* 18.7* 17.5* 16.0*  CREATININE 0.72 0.66 0.64 0.56    Estimated Creatinine Clearance: 174.5 mL/min (by C-G formula based on SCr of 0.56 mg/dL).    Allergies  Allergen Reactions  . Penicillins Hives    Has patient had a PCN reaction causing immediate rash, facial/tongue/throat swelling, SOB or lightheadedness with hypotension: Yes Has patient had a PCN reaction causing severe rash involving mucus membranes or skin necrosis: No Has patient had a PCN reaction that required hospitalization: Yes Has patient had a PCN reaction occurring within the last 10 years: No If all of the above answers are "NO", then may proceed with Cephalosporin use.     Antimicrobials this admission: 10/11 azithromycin po x 1 10/11 CTX x 1 10/11 cefepime x 1 10/11 flagyl x1 10/11 merrem >> Dose adjustments this admission:  Microbiology results: 10/11 UCx: multiple species 10/11 Wet prep: clue cells present, many WBC 10/11 RPR: non reactive 10/11 HIV Ab: non reactive  Thank you for allowing pharmacy to be a part of this patient's care  Herby Abraham, Pharm.D [phone] 08/10/2018 10:11 AM

## 2018-08-10 NOTE — Progress Notes (Signed)
[facility] Progress Note     Subjective: CC:  C/o intermittent LLQ pain that is severe and lasts for roughly 10 minutes. Tolerating clears. Denies N/V. Denies BM.  Objective: Vital signs in last 24 hours: Temp:  [99 F (37.2 C)-99.3 F (37.4 C)] 99.2 F (37.3 C) (10/14 0520) Pulse Rate:  [98-102] 98 (10/14 0520) Resp:  [18] 18 (10/14 0520) BP: (118-146)/(69-95) 146/89 (10/14 0520) SpO2:  [99 %-100 %] 99 % (10/14 0520) Last BM Date: 08/06/18  Intake/Output from previous day: 10/13 0701 - 10/14 0700 In: 1413.2 [I.V.:1082.8; IV Piggyback:330.4] Out: 1500 [Urine:1500] Intake/Output this shift: No intake/output data recorded.  PE: Gen:  Alert, NAD, pleasant and cooperative  Card:  Regular rate and rhythm Pulm:  Normal effort Abd: Soft, obese, TTP RLQ, no peritonitis, no guarding  Skin: warm and dry, no rashes  Psych: A&Ox3   Lab Results:  Recent Labs    08/09/18 0818 08/10/18 0429  WBC 17.5* 16.0*  HGB 10.1* 10.0*  HCT 33.6* 33.3*  PLT 332 329   BMET Recent Labs    08/09/18 0818 08/10/18 0429  NA 140 138  K 4.0 4.1  CL 107 104  CO2 25 23  GLUCOSE 97 90  BUN 5* 5*  CREATININE 0.64 0.56  CALCIUM 8.8* 8.8*   PT/INR No results for input(s): LABPROT, INR in the last 72 hours. CMP     Component Value Date/Time   NA 138 08/10/2018 0429   K 4.1 08/10/2018 0429   CL 104 08/10/2018 0429   CO2 23 08/10/2018 0429   GLUCOSE 90 08/10/2018 0429   BUN 5 (L) 08/10/2018 0429   CREATININE 0.56 08/10/2018 0429   CALCIUM 8.8 (L) 08/10/2018 0429   PROT 7.1 08/07/2018 1053   ALBUMIN 3.3 (L) 08/07/2018 1053   AST 16 08/07/2018 1053   ALT 15 08/07/2018 1053   ALKPHOS 71 08/07/2018 1053   BILITOT 0.9 08/07/2018 1053   GFRNONAA >60 08/10/2018 0429   GFRAA >60 08/10/2018 0429   Lipase     Component Value Date/Time   LIPASE 25 08/07/2018 1053       Studies/Results: No results found.  Anti-infectives: Anti-infectives (From admission, onward)   Start     Dose/Rate Route Frequency Ordered Stop   08/07/18 2200  meropenem (MERREM) 2 g in sodium chloride 0.9 % 100 mL IVPB     2 g 200 mL/hr over 30 Minutes Intravenous Every 8 hours 08/07/18 1744     08/07/18 1304  ceFEPIme (MAXIPIME) 2 g injection    Note to Pharmacy:  Estrella Deeds   : cabinet override      08/07/18 1304 08/08/18 0114   08/07/18 1300  ceFEPIme (MAXIPIME) 2 g in sodium chloride 0.9 % 100 mL IVPB     2 g 200 mL/hr over 30 Minutes Intravenous  Once 08/07/18 1254 08/07/18 1353   08/07/18 1300  metroNIDAZOLE (FLAGYL) IVPB 500 mg     500 mg 100 mL/hr over 60 Minutes Intravenous  Once 08/07/18 1254 08/07/18 1456   08/07/18 1115  cefTRIAXone (ROCEPHIN) injection 250 mg     250 mg Intramuscular  Once 08/07/18 1110 08/07/18 1127   08/07/18 1115  azithromycin (ZITHROMAX) tablet 1,000 mg     1,000 mg Oral  Once 08/07/18 1110 08/07/18 1126     Assessment/Plan  Diverticulitis with microperforation - afebrile, VSS, WBC 16 from 17.5 - tolerating clears, continue CLD  - PRN analgesics and antiemetics  - mobilize and OOB to chair  FEN: clears , BMET unremarkable; schedule tylenol and ibuprofen, add PRN robaxin  ID: Rocephin 10/11, Cefepime 10/11, Meropenem 10/11 >> VTE: PAS, Lovenox    LOS: 3 days    Morgan Bryant, [facility] Surgery Pager: [phone]

## 2018-08-11 LAB — CBC
HEMATOCRIT: 31.8 % — AB (ref 36.0–46.0)
Hemoglobin: 9.5 g/dL — ABNORMAL LOW (ref 12.0–15.0)
MCH: 22.9 pg — ABNORMAL LOW (ref 26.0–34.0)
MCHC: 29.9 g/dL — ABNORMAL LOW (ref 30.0–36.0)
MCV: 76.6 fL — AB (ref 80.0–100.0)
NRBC: 0 % (ref 0.0–0.2)
Platelets: 312 10*3/uL (ref 150–400)
RBC: 4.15 MIL/uL (ref 3.87–5.11)
RDW: 16.9 % — ABNORMAL HIGH (ref 11.5–15.5)
WBC: 11.3 10*3/uL — AB (ref 4.0–10.5)

## 2018-08-11 MED ORDER — MORPHINE SULFATE (PF) 2 MG/ML IV SOLN: 1.0000 mg | INTRAVENOUS | Status: DC | PRN

## 2018-08-11 NOTE — Discharge Summary (Signed)
[facility] Discharge Summary   [name] MRN: [id number] DOB/AGE: 36/23/1982 36 y.o.  Admit date: 08/07/2018 Discharge date: 08/12/2018    Discharge Diagnosis Patient Active Problem List   Diagnosis Date Noted  . Diverticulitis 08/07/2018  . Asthma exacerbation 06/20/2017  . OSA (obstructive sleep apnea) 06/20/2017  . Essential hypertension 06/20/2017  . Morbid obesity (HCC) 06/20/2017  . Acute asthma exacerbation 06/20/2017    Procedures none  Hospital Course:  Morgan Bryant is a 37 year old obese female with a past medical history of hypertension, asthma, and OSA who presented to the emergency department with a chief complaint of left lower quadrant pain.  Work-up was significant for a leukocytosis of 19,000 and CT of the abdomen and pelvis was consistent with sigmoid diverticulitis with microperforation.  There was no abscess.  The patient was admitted and started on IV antibiotics and bowel rest.  Her pain gradually improved.  Diet was advanced.  On 08/12/2018 patient was afebrile, vitals stable, pain resolved, having bowel function, and medically stable for discharge home.  She will require outpatient follow-up for colonoscopy as well as follow-up with one of our colorectal surgeons.  Dietary instructions discussed with the patient she knows to call with questions or concerns.  Physical Exam: Gen - alert, NAD, cooperative and pleasant CV - RRR Pulm - normal effort, CTAB Abd - soft, obese, mild LLQ tenderness to deep palpation without peritonitis or guarding, +BS  Allergies as of 08/12/2018      Reactions   Penicillins Hives   Has patient had a PCN reaction causing immediate rash, facial/tongue/throat swelling, SOB or lightheadedness with hypotension: Yes Has patient had a PCN reaction causing severe rash involving mucus membranes or skin necrosis: No Has patient had a PCN reaction that required hospitalization: Yes Has patient had a PCN reaction  occurring within the last 10 years: No If all of the above answers are "NO", then may proceed with Cephalosporin use.      Medication List    STOP taking these medications   predniSONE 20 MG tablet Commonly known as:  DELTASONE     TAKE these medications   acetaminophen 500 MG tablet Commonly known as:  TYLENOL Take 2 tablets (1,000 mg total) by mouth every 8 (eight) hours.   albuterol (2.5 MG/3ML) 0.083% nebulizer solution Commonly known as:  PROVENTIL Take 3 mLs (2.5 mg total) by nebulization every 6 (six) hours as needed for wheezing or shortness of breath.   bisacodyl 5 MG EC tablet Commonly known as:  DULCOLAX Take 10 mg by mouth daily as needed for moderate constipation.   ciprofloxacin 500 MG tablet Commonly known as:  CIPRO Take 1 tablet (500 mg total) by mouth 2 (two) times daily for 7 days.   docusate sodium 50 MG capsule Commonly known as:  COLACE Take 2 capsules (100 mg total) by mouth 2 (two) times daily.   ferrous sulfate 325 (65 FE) MG tablet Take 1 tablet (325 mg total) by mouth 2 (two) times daily with a meal.   fluticasone 50 MCG/BLIST diskus inhaler Commonly known as:  FLOVENT DISKUS Inhale 2 puffs into the lungs 2 (two) times daily.   ibuprofen 200 MG tablet Commonly known as:  ADVIL,MOTRIN Take 800 mg by mouth every 6 (six) hours as needed for moderate pain.   metroNIDAZOLE 500 MG tablet Commonly known as:  FLAGYL Take 1 tablet (500 mg total) by mouth 2 (two) times daily with a meal. DO NOT CONSUME ALCOHOL WHILE TAKING  THIS MEDICATION.   nicotine 14 mg/24hr patch Commonly known as:  NICODERM CQ - dosed in mg/24 hours Place 1 patch (14 mg total) onto the skin daily.        Follow-up Information    Andria Meuse, MD. Schedule an appointment as soon as possible for a visit in 4 week(s).   Specialty:  General Surgery Why:  for follow up from recent hospitalization for diverticulitis. Contact information: [address] [place]  [place] [address] [phone]           Signed: Hosie Spangle, [facility] Surgery 08/12/2018, 2:29 PM

## 2018-08-11 NOTE — Progress Notes (Signed)
[facility] Progress Note     Subjective: CC:  Patient reports significant improvement in her abdominal pain beginning around 2 PM yesterday.  Endorses mild nausea last night that has resolved.  Reports flatus but denies BM.  Feels she is moving around with less pain.  Her mother is coming to visit and she plans to ambulate in the hallway with her mom.  The patient and I discussed stopping the use of IV morphine and the patient was encouraged to use her oral pain meds.  Objective: Vital signs in last 24 hours: Temp:  [98.3 F (36.8 C)-99.3 F (37.4 C)] 98.5 F (36.9 C) (10/15 0506) Pulse Rate:  [82-95] 83 (10/15 0506) Resp:  [16-17] 16 (10/14 2126) BP: (100-140)/(66-95) 125/80 (10/15 0506) SpO2:  [96 %-100 %] 96 % (10/15 0506) Last BM Date: 08/13/18  Intake/Output from previous day: 10/14 0701 - 10/15 0700 In: 2874.8 [P.O.:900; I.V.:1615.2; IV Piggyback:359.6] Out: 800 [Urine:800] Intake/Output this shift: No intake/output data recorded.  PE: Gen:  Alert, NAD, pleasant Card:  Regular rate and rhythm, pedal pulses 2+ BL Pulm:  Normal effort, clear to auscultation bilaterally Abd: Soft, obese, mild central lower abdominal tenderness without peritonitis or guarding, bowel sounds present  Skin: warm and dry, no rashes  Psych: A&Ox3   Lab Results:  Recent Labs    08/10/18 0429 08/11/18 0515  WBC 16.0* 11.3*  HGB 10.0* 9.5*  HCT 33.3* 31.8*  PLT 329 312   BMET Recent Labs    08/09/18 0818 08/10/18 0429  NA 140 138  K 4.0 4.1  CL 107 104  CO2 25 23  GLUCOSE 97 90  BUN 5* 5*  CREATININE 0.64 0.56  CALCIUM 8.8* 8.8*   PT/INR No results for input(s): LABPROT, INR in the last 72 hours. CMP     Component Value Date/Time   NA 138 08/10/2018 0429   K 4.1 08/10/2018 0429   CL 104 08/10/2018 0429   CO2 23 08/10/2018 0429   GLUCOSE 90 08/10/2018 0429   BUN 5 (L) 08/10/2018 0429   CREATININE 0.56 08/10/2018 0429   CALCIUM 8.8 (L) 08/10/2018 0429   PROT 7.1 08/07/2018 1053   ALBUMIN 3.3 (L) 08/07/2018 1053   AST 16 08/07/2018 1053   ALT 15 08/07/2018 1053   ALKPHOS 71 08/07/2018 1053   BILITOT 0.9 08/07/2018 1053   GFRNONAA >60 08/10/2018 0429   GFRAA >60 08/10/2018 0429   Lipase     Component Value Date/Time   LIPASE 25 08/07/2018 1053       Studies/Results: No results found.  Anti-infectives: Anti-infectives (From admission, onward)   Start     Dose/Rate Route Frequency Ordered Stop   08/07/18 2200  meropenem (MERREM) 2 g in sodium chloride 0.9 % 100 mL IVPB     2 g 200 mL/hr over 30 Minutes Intravenous Every 8 hours 08/07/18 1744     08/07/18 1304  ceFEPIme (MAXIPIME) 2 g injection    Note to Pharmacy:  Estrella Deeds   : cabinet override      08/07/18 1304 08/08/18 0114   08/07/18 1300  ceFEPIme (MAXIPIME) 2 g in sodium chloride 0.9 % 100 mL IVPB     2 g 200 mL/hr over 30 Minutes Intravenous  Once 08/07/18 1254 08/07/18 1353   08/07/18 1300  metroNIDAZOLE (FLAGYL) IVPB 500 mg     500 mg 100 mL/hr over 60 Minutes Intravenous  Once 08/07/18 1254 08/07/18 1456   08/07/18 1115  cefTRIAXone (ROCEPHIN) injection 250  mg     250 mg Intramuscular  Once 08/07/18 1110 08/07/18 1127   08/07/18 1115  azithromycin (ZITHROMAX) tablet 1,000 mg     1,000 mg Oral  Once 08/07/18 1110 08/07/18 1126     Assessment/Plan Diverticulitis with microperforation - afebrile, VSS, WBC 11 from 16 - tolerating clears, advance to full liquid diet - PRN analgesics and antiemetics, encourage p.o. pain control and decrease use of morphine - mobilize and OOB to chair  - Possible discharge home tomorrow on p.o. antibiotics  FEN: Full liquid diet ID: Rocephin 10/11, Cefepime 10/11, Meropenem 10/11 >> VTE: PAS, Lovenox     LOS: 4 days    Hosie Spangle, [facility] Surgery Pager: [phone]

## 2018-08-12 LAB — CBC
HEMATOCRIT: 31.6 % — AB (ref 36.0–46.0)
Hemoglobin: 9.5 g/dL — ABNORMAL LOW (ref 12.0–15.0)
MCH: 23.1 pg — AB (ref 26.0–34.0)
MCHC: 30.1 g/dL (ref 30.0–36.0)
MCV: 76.7 fL — AB (ref 80.0–100.0)
NRBC: 0 % (ref 0.0–0.2)
Platelets: 355 10*3/uL (ref 150–400)
RBC: 4.12 MIL/uL (ref 3.87–5.11)
RDW: 16.8 % — ABNORMAL HIGH (ref 11.5–15.5)
WBC: 11.9 10*3/uL — ABNORMAL HIGH (ref 4.0–10.5)

## 2018-08-12 MED ORDER — CIPROFLOXACIN HCL 500 MG PO TABS: 500.0000 mg | ORAL_TABLET | Freq: Two times a day (BID) | ORAL | 0 refills | Status: AC

## 2018-08-12 MED ORDER — METRONIDAZOLE 500 MG PO TABS: 500.0000 mg | ORAL_TABLET | Freq: Two times a day (BID) | ORAL | 0 refills | Status: DC

## 2018-08-12 MED ORDER — ACETAMINOPHEN 500 MG PO TABS: 1000.0000 mg | ORAL_TABLET | Freq: Three times a day (TID) | ORAL | 0 refills | Status: DC

## 2018-08-12 NOTE — Progress Notes (Signed)
Discharge instructions given to pt and all questions were answered. Pt taken down via wheelchair and was picked up by her friend.  

## 2018-11-03 ENCOUNTER — Other Ambulatory Visit: Payer: Self-pay

## 2018-11-03 ENCOUNTER — Emergency Department (HOSPITAL_BASED_OUTPATIENT_CLINIC_OR_DEPARTMENT_OTHER)
Admission: EM | Admit: 2018-11-03 | Discharge: 2018-11-03 | Disposition: A | Discharge status: Home / Self Care | Payer: Medicaid Other | Attending: Emergency Medicine | Admitting: Emergency Medicine

## 2018-11-03 ENCOUNTER — Encounter (HOSPITAL_BASED_OUTPATIENT_CLINIC_OR_DEPARTMENT_OTHER): Payer: Self-pay

## 2018-11-03 ENCOUNTER — Emergency Department (HOSPITAL_BASED_OUTPATIENT_CLINIC_OR_DEPARTMENT_OTHER): Payer: Medicaid Other

## 2018-11-03 DIAGNOSIS — J111 Influenza due to unidentified influenza virus with other respiratory manifestations: Secondary | ICD-10-CM

## 2018-11-03 DIAGNOSIS — J020 Streptococcal pharyngitis: Secondary | ICD-10-CM | POA: Insufficient documentation

## 2018-11-03 DIAGNOSIS — F17210 Nicotine dependence, cigarettes, uncomplicated: Secondary | ICD-10-CM | POA: Insufficient documentation

## 2018-11-03 DIAGNOSIS — R69 Illness, unspecified: Secondary | ICD-10-CM

## 2018-11-03 DIAGNOSIS — J45909 Unspecified asthma, uncomplicated: Secondary | ICD-10-CM | POA: Insufficient documentation

## 2018-11-03 DIAGNOSIS — Z79899 Other long term (current) drug therapy: Secondary | ICD-10-CM | POA: Insufficient documentation

## 2018-11-03 DIAGNOSIS — D649 Anemia, unspecified: Secondary | ICD-10-CM | POA: Insufficient documentation

## 2018-11-03 DIAGNOSIS — I10 Essential (primary) hypertension: Secondary | ICD-10-CM | POA: Insufficient documentation

## 2018-11-03 LAB — PREGNANCY, URINE: Preg Test, Ur: NEGATIVE

## 2018-11-03 LAB — GROUP A STREP BY PCR: GROUP A STREP BY PCR: DETECTED — AB

## 2018-11-03 MED ORDER — AZITHROMYCIN 250 MG PO TABS: 250.0000 mg | ORAL_TABLET | Freq: Every day | ORAL | 0 refills | Status: DC

## 2018-11-03 MED ORDER — ONDANSETRON 4 MG PO TBDP: 4.0000 mg | ORAL_TABLET | Freq: Three times a day (TID) | ORAL | 0 refills | Status: DC | PRN

## 2018-11-03 MED ORDER — OSELTAMIVIR PHOSPHATE 75 MG PO CAPS: 75.0000 mg | ORAL_CAPSULE | Freq: Two times a day (BID) | ORAL | 0 refills | Status: DC

## 2018-11-03 MED ORDER — ALBUTEROL SULFATE HFA 108 (90 BASE) MCG/ACT IN AERS: 1.0000 | INHALATION_SPRAY | Freq: Four times a day (QID) | RESPIRATORY_TRACT | 3 refills | Status: DC | PRN

## 2018-11-03 MED ORDER — IBUPROFEN 800 MG PO TABS
800.0000 mg | ORAL_TABLET | Freq: Once | ORAL | Status: AC
  Administered 2018-11-03: 800 mg via ORAL
  Filled 2018-11-03: qty 1

## 2018-11-03 NOTE — Discharge Instructions (Signed)
You can take Tylenol or Ibuprofen as directed for pain. You can alternate Tylenol and Ibuprofen every 4 hours. If you take Tylenol at 1pm, then you can take Ibuprofen at 5pm. Then you can take Tylenol again at 9pm.   Take antibiotics as directed. Please take all of your antibiotics until finished.  As we discussed today, your symptoms are similar to flu.  Given history of asthma, you can be treated with Tamiflu to help with symptoms.  Is your choice if you would like to take it.  As discussed, Tamiflu is not a cure for the flu but can sometimes help the length of symptoms.  Additionally, this may make you have more nausea/vomiting.  He can take Zofran as directed.  Make sure you drink plenty fluids and staying hydrated.  Return emergency department for any difficulty breathing, high fevers despite Tylenol or ibuprofen, vomiting or any other worsening or concerning symptoms.

## 2018-11-03 NOTE — ED Provider Notes (Signed)
[facility] EMERGENCY DEPARTMENT Provider Note   CSN: [id number] Arrival date & time: 11/03/18  1926     History   Chief Complaint Chief Complaint  Patient presents with  . URI    HPI Morgan Bryant is a 38 y.o. female past no history of asthma, anemia, hypertension who presents for evaluation of sore throat, generalized fevers, chills, rhinorrhea, nasal congestion, bilateral ear pain, generalized weakness, generalized body aches that began yesterday.  Patient states that she "feels weak and achy all over."  She states that she has still been able to swallow her secretions and tolerate p.o. but does report worsening pain with swallowing.  Patient states she has not had any difficulty eating or drinking denies any nausea/vomiting.  Patient reports that she has not been having any cough.  Patient states she is taken cough drops for symptoms minimal relief.  Patient does report a history of asthma.  She states she has had been hospitalized for asthma but never intubated.  Patient does report that she did not get a flu shot this year.  She does report sick contacts.  The history is provided by the patient.    Past Medical History:  Diagnosis Date  . Anemia   . Asthma   . Hypertension   . Morbid obesity (HCC)   . Sleep apnea     Patient Active Problem List   Diagnosis Date Noted  . Diverticulitis 08/07/2018  . Asthma exacerbation 06/20/2017  . OSA (obstructive sleep apnea) 06/20/2017  . Essential hypertension 06/20/2017  . Morbid obesity (HCC) 06/20/2017  . Acute asthma exacerbation 06/20/2017    Past Surgical History:  Procedure Laterality Date  . CESAREAN SECTION       OB History   No obstetric history on file.      Home Medications    Prior to Admission medications   Medication Sig Start Date End Date Taking? Authorizing Provider  acetaminophen (TYLENOL) 500 MG tablet Take 2 tablets (1,000 mg total) by mouth every 8 (eight) hours. 08/12/18   Adam PhenixSimaan,  Elizabeth S, PA-C  albuterol (PROVENTIL HFA;VENTOLIN HFA) 108 (90 Base) MCG/ACT inhaler Inhale 1-2 puffs into the lungs every 6 (six) hours as needed for wheezing or shortness of breath. 11/03/18   Maxwell CaulLayden, Dahna Hattabaugh A, PA-C  azithromycin (ZITHROMAX) 250 MG tablet Take 1 tablet (250 mg total) by mouth daily. Take first 2 tablets together, then 1 every day until finished. 11/03/18   Maxwell CaulLayden, Rai Sinagra A, PA-C  bisacodyl (DULCOLAX) 5 MG EC tablet Take 10 mg by mouth daily as needed for moderate constipation.    [provider]  docusate sodium (COLACE) 50 MG capsule Take 2 capsules (100 mg total) by mouth 2 (two) times daily. Patient not taking: Reported on 08/07/2018 06/24/17   Osvaldo ShipperKrishnan, Gokul, MD  ferrous sulfate 325 (65 FE) MG tablet Take 1 tablet (325 mg total) by mouth 2 (two) times daily with a meal. Patient not taking: Reported on 08/07/2018 06/24/17   Osvaldo ShipperKrishnan, Gokul, MD  fluticasone (FLOVENT DISKUS) 50 MCG/BLIST diskus inhaler Inhale 2 puffs into the lungs 2 (two) times daily. Patient not taking: Reported on 08/07/2018 06/24/17   Osvaldo ShipperKrishnan, Gokul, MD  ibuprofen (ADVIL,MOTRIN) 200 MG tablet Take 800 mg by mouth every 6 (six) hours as needed for moderate pain.    [provider]  metroNIDAZOLE (FLAGYL) 500 MG tablet Take 1 tablet (500 mg total) by mouth 2 (two) times daily with a meal. DO NOT CONSUME ALCOHOL WHILE TAKING THIS MEDICATION. 08/12/18  Adam Phenix, PA-C  nicotine (NICODERM CQ - DOSED IN MG/24 HOURS) 14 mg/24hr patch Place 1 patch (14 mg total) onto the skin daily. Patient not taking: Reported on 08/07/2018 06/25/17   Osvaldo Shipper, MD  ondansetron (ZOFRAN ODT) 4 MG disintegrating tablet Take 1 tablet (4 mg total) by mouth every 8 (eight) hours as needed for nausea or vomiting. 11/03/18   Maxwell Caul, PA-C  oseltamivir (TAMIFLU) 75 MG capsule Take 1 capsule (75 mg total) by mouth every 12 (twelve) hours. 11/03/18   Maxwell Caul, PA-C    Family History Family  History  Problem Relation Age of Onset  . Hypertension Mother   . Cancer Father     Social History Social History   Tobacco Use  . Smoking status: Current Every Day Smoker    Packs/day: 0.50    Types: Cigarettes  . Smokeless tobacco: Never Used  Substance Use Topics  . Alcohol use: Yes    Comment: occ  . Drug use: No     Allergies   Penicillins   Review of Systems Review of Systems  Constitutional: Positive for fatigue and fever.  HENT: Positive for congestion, ear pain, rhinorrhea and sore throat. Negative for drooling and trouble swallowing.   Respiratory: Negative for cough and shortness of breath.   Cardiovascular: Negative for chest pain.  Gastrointestinal: Negative for abdominal pain, nausea and vomiting.  Genitourinary: Negative for dysuria and hematuria.  Musculoskeletal: Positive for myalgias.  Neurological: Negative for headaches.  All other systems reviewed and are negative.    Physical Exam Updated Vital Signs BP 136/75   Pulse 98   Temp 99.9 F (37.7 C)   Resp 18   Ht 5\' 10"  (1.778 m)   Wt (!) 177.4 kg   LMP 10/14/2018   SpO2 100%   BMI 56.10 kg/m   Physical Exam Vitals signs and nursing note reviewed.  Constitutional:      Appearance: She is well-developed.  HENT:     Head: Normocephalic and atraumatic.     Right Ear: Tympanic membrane normal.     Left Ear: Tympanic membrane normal.     Nose: Congestion present.     Comments: Erythematous and edematous nasal turbinates bilaterally.    Mouth/Throat:     Mouth: Mucous membranes are moist.     Pharynx: Uvula midline. Posterior oropharyngeal erythema present.     Comments: Airways patent combination is intact.  Posterior oropharynx is slightly erythematous but without any signs of edema.  No tonsillar exudates.  Uvula is midline.  No trismus. Eyes:     General: No scleral icterus.       Right eye: No discharge.        Left eye: No discharge.     Conjunctiva/sclera: Conjunctivae normal.    Cardiovascular:     Rate and Rhythm: Regular rhythm. Tachycardia present.  Pulmonary:     Effort: Pulmonary effort is normal.     Breath sounds: Normal breath sounds.     Comments: Lungs clear to auscultation bilaterally.  Symmetric chest rise.  No wheezing, rales, rhonchi.  Exam limited body habitus. Lymphadenopathy:     Cervical: No cervical adenopathy.  Skin:    General: Skin is warm and dry.  Neurological:     Mental Status: She is alert.  Psychiatric:        Speech: Speech normal.        Behavior: Behavior normal.      ED Treatments / Results  Labs (  all labs ordered are listed, but only abnormal results are displayed) Labs Reviewed  GROUP A STREP BY PCR - Abnormal; Notable for the following components:      Result Value   Group A Strep by PCR DETECTED (*)    All other components within normal limits  PREGNANCY, URINE    EKG None  Radiology Dg Chest 2 View  Result Date: 11/03/2018 CLINICAL DATA:  Cough and weakness x2 days EXAM: CHEST - 2 VIEW COMPARISON:  06/20/2017 FINDINGS: The heart size and mediastinal contours are within normal limits. Mild increase in interstitial lung markings with peribronchial thickening suspicious for small airway inflammation. No alveolar consolidation. No effusion or pneumothorax. The visualized skeletal structures are unremarkable. IMPRESSION: Mild increase in interstitial lung markings with peribronchial thickening, suspicious for small airway inflammation. Electronically Signed   By: Tollie Eth M.D.   On: 11/03/2018 21:24    Procedures Procedures (including critical care time)  Medications Ordered in ED Medications  ibuprofen (ADVIL,MOTRIN) tablet 800 mg (800 mg Oral Given 11/03/18 1954)     Initial Impression / Assessment and Plan / ED Course  I have reviewed the triage vital signs and the nursing notes.  Pertinent labs & imaging results that were available during my care of the patient were reviewed by me and considered in my  medical decision making (see chart for details).     38 year old female who presents for evaluation of sore throat, generalized body aches, fever, fatigue, myalgias that began yesterday.  She reports she has been able to tolerate p.o.  Directed fever chills at home.  Initial ED arrival, patient is febrile, tachycardic.  Vitals otherwise stable.  On exam, she has posterior oropharynx erythema.  Uvula is midline.  No trismus.  Consider pharyngitis.  History/physical exam not concerning for Ludwig angina or peritonsillar abscess.  Additionally, concern for influenza given history of sudden at onset of symptoms as well as diffuse body aches.  Patient with history of asthma that has required hospitalization.  No obvious wheezing noted on exam.  We will plan for chest x-ray to eval for pneumonia.  Strep reviewed and is positive.  Chest x-ray negative for any acute infiltrate though there is some peribronchial thickening.  Discussed results with patient.  Patient does report she is allergic to penicillin.  We will plan to treat for strep.  Additionally, discussed with patient regarding influenza.  She does have a history of asthma and is therefore a candidate for Tamiflu.  I discussed with patient that her symptoms could be due to strep throat but given other symptoms, could be due to influenzas.  Gust with patient regarding treatment with Tamiflu.  We will plan to send home with this completed, Zofran.  Additionally, patient requesting albuterol inhalers for refills.  P vitals improved after analgesics here in the ED. At this time, patient exhibits no emergent life-threatening condition that require further evaluation in ED or admission. Patient had ample opportunity for questions and discussion. All patient's questions were answered with full understanding. Strict return precautions discussed. Patient expresses understanding and agreement to plan.   Portions of this note were generated with Herbal[employer]. Dictation errors may occur despite best attempts at proofreading.   Final Clinical Impressions(s) / ED Diagnoses   Final diagnoses:  Strep pharyngitis  Influenza-like illness    ED Discharge Orders         Ordered    azithromycin (ZITHROMAX) 250 MG tablet  Daily     11/03/18 2201  oseltamivir (TAMIFLU) 75 MG capsule  Every 12 hours     11/03/18 2201    albuterol (PROVENTIL HFA;VENTOLIN HFA) 108 (90 Base) MCG/ACT inhaler  Every 6 hours PRN     11/03/18 2201    ondansetron (ZOFRAN ODT) 4 MG disintegrating tablet  Every 8 hours PRN     11/03/18 2201           Maxwell Caul, PA-C 11/03/18 2314    Melene Plan, DO 11/03/18 2320

## 2018-11-03 NOTE — ED Triage Notes (Signed)
Co URI sx day 2-NAD-to triage in w/c

## 2018-11-03 NOTE — ED Notes (Signed)
ED Provider at bedside. 

## 2018-11-18 ENCOUNTER — Emergency Department (HOSPITAL_BASED_OUTPATIENT_CLINIC_OR_DEPARTMENT_OTHER): Payer: Self-pay

## 2018-11-18 ENCOUNTER — Other Ambulatory Visit: Payer: Self-pay

## 2018-11-18 ENCOUNTER — Emergency Department (HOSPITAL_BASED_OUTPATIENT_CLINIC_OR_DEPARTMENT_OTHER)
Admission: EM | Admit: 2018-11-18 | Discharge: 2018-11-18 | Disposition: A | Discharge status: Home / Self Care | Payer: Self-pay | Attending: Emergency Medicine | Admitting: Emergency Medicine

## 2018-11-18 ENCOUNTER — Encounter (HOSPITAL_BASED_OUTPATIENT_CLINIC_OR_DEPARTMENT_OTHER): Payer: Self-pay

## 2018-11-18 DIAGNOSIS — F17210 Nicotine dependence, cigarettes, uncomplicated: Secondary | ICD-10-CM | POA: Insufficient documentation

## 2018-11-18 DIAGNOSIS — J020 Streptococcal pharyngitis: Secondary | ICD-10-CM | POA: Insufficient documentation

## 2018-11-18 DIAGNOSIS — I10 Essential (primary) hypertension: Secondary | ICD-10-CM | POA: Insufficient documentation

## 2018-11-18 DIAGNOSIS — Z79899 Other long term (current) drug therapy: Secondary | ICD-10-CM | POA: Insufficient documentation

## 2018-11-18 DIAGNOSIS — J45909 Unspecified asthma, uncomplicated: Secondary | ICD-10-CM | POA: Insufficient documentation

## 2018-11-18 LAB — CBC WITH DIFFERENTIAL/PLATELET
Abs Immature Granulocytes: 0.06 10*3/uL (ref 0.00–0.07)
BASOS PCT: 0 %
Basophils Absolute: 0.1 10*3/uL (ref 0.0–0.1)
EOS ABS: 0.5 10*3/uL (ref 0.0–0.5)
Eosinophils Relative: 3 %
HEMATOCRIT: 39.9 % (ref 36.0–46.0)
Hemoglobin: 11.7 g/dL — ABNORMAL LOW (ref 12.0–15.0)
Immature Granulocytes: 0 %
LYMPHS ABS: 4.8 10*3/uL — AB (ref 0.7–4.0)
Lymphocytes Relative: 30 %
MCH: 23 pg — AB (ref 26.0–34.0)
MCHC: 29.3 g/dL — ABNORMAL LOW (ref 30.0–36.0)
MCV: 78.5 fL — AB (ref 80.0–100.0)
MONO ABS: 1 10*3/uL (ref 0.1–1.0)
MONOS PCT: 6 %
NEUTROS PCT: 61 %
Neutro Abs: 9.4 10*3/uL — ABNORMAL HIGH (ref 1.7–7.7)
Platelets: 329 10*3/uL (ref 150–400)
RBC: 5.08 MIL/uL (ref 3.87–5.11)
RDW: 18 % — AB (ref 11.5–15.5)
WBC: 15.8 10*3/uL — ABNORMAL HIGH (ref 4.0–10.5)
nRBC: 0 % (ref 0.0–0.2)

## 2018-11-18 LAB — I-STAT CG4 LACTIC ACID, ED: LACTIC ACID, VENOUS: 1.13 mmol/L (ref 0.5–1.9)

## 2018-11-18 LAB — BASIC METABOLIC PANEL
Anion gap: 6 (ref 5–15)
BUN: 9 mg/dL (ref 6–20)
CALCIUM: 9 mg/dL (ref 8.9–10.3)
CO2: 25 mmol/L (ref 22–32)
CREATININE: 0.62 mg/dL (ref 0.44–1.00)
Chloride: 105 mmol/L (ref 98–111)
GFR calc Af Amer: >60 mL/min (ref >60)
Glucose, Bld: 95 mg/dL (ref 70–99)
Potassium: 3.5 mmol/L (ref 3.5–5.1)
Sodium: 136 mmol/L (ref 135–145)

## 2018-11-18 LAB — MONONUCLEOSIS SCREEN: MONO SCREEN: NEGATIVE

## 2018-11-18 LAB — PREGNANCY, URINE: Preg Test, Ur: NEGATIVE

## 2018-11-18 LAB — GROUP A STREP BY PCR: Group A Strep by PCR: DETECTED — AB

## 2018-11-18 MED ORDER — CLINDAMYCIN HCL 300 MG PO CAPS: 300.0000 mg | ORAL_CAPSULE | Freq: Three times a day (TID) | ORAL | 0 refills | Status: AC

## 2018-11-18 MED ORDER — SODIUM CHLORIDE 0.9 % IV BOLUS
1000.0000 mL | Freq: Once | INTRAVENOUS | Status: AC
  Administered 2018-11-18: 1000 mL via INTRAVENOUS

## 2018-11-18 MED ORDER — ONDANSETRON 4 MG PO TBDP: 4.0000 mg | ORAL_TABLET | Freq: Three times a day (TID) | ORAL | 0 refills | Status: DC | PRN

## 2018-11-18 MED ORDER — IOPAMIDOL (ISOVUE-300) INJECTION 61%
100.0000 mL | Freq: Once | INTRAVENOUS | Status: AC | PRN
  Administered 2018-11-18: 75 mL via INTRAVENOUS

## 2018-11-18 MED ORDER — DEXAMETHASONE SODIUM PHOSPHATE 10 MG/ML IJ SOLN
10.0000 mg | Freq: Once | INTRAMUSCULAR | Status: AC
  Administered 2018-11-18: 10 mg via INTRAVENOUS
  Filled 2018-11-18: qty 1

## 2018-11-18 NOTE — ED Notes (Signed)
NAD at this time. Pt is stable and going home.  

## 2018-11-18 NOTE — ED Notes (Signed)
Pt given Ginger ale.

## 2018-11-18 NOTE — ED Provider Notes (Signed)
[facility] EMERGENCY DEPARTMENT Provider Note   CSN: [id number] Arrival date & time: 11/18/18  1337     History   Chief Complaint Chief Complaint  Patient presents with  . URI    HPI Barron SchmidKenyatta Bryant is a 38 y.o. female.  HPI   Barron SchmidKenyatta Bryant is a 38 y.o. female, with a history of anemia, asthma, HTN, and morbid obesity, presenting to the ED with throat pain and swelling for the past 4 days.  Pain feels like a pressure and sharp, moderate, radiating throughout the right side of the neck, under the jaw, and right side of the face.  Patient was seen in the ED on January 7, diagnosed with strep throat, and prescribed azithromycin.  She had been experiencing posterior throat pain at that time as well as fever and a feeling of swelling.  The symptoms seemed to resolve following completion of the azithromycin.  4 days ago, she began to have swelling under her chin, pain and tenderness to the right side of her face, and tenderness with swelling to the right side of the neck. She states, "It is not really that my throat is sore inside anymore it is that the front and side of my neck hurts and is swollen.   Denies cough, drooling, fever with these additional symptoms, chest pain, difficulty swallowing, neurologic dysfunction, neck stiffness, rash, N/V/D, or any other complaints.     Past Medical History:  Diagnosis Date  . Anemia   . Asthma   . Hypertension   . Morbid obesity (HCC)   . Sleep apnea     Patient Active Problem List   Diagnosis Date Noted  . Diverticulitis 08/07/2018  . Asthma exacerbation 06/20/2017  . OSA (obstructive sleep apnea) 06/20/2017  . Essential hypertension 06/20/2017  . Morbid obesity (HCC) 06/20/2017  . Acute asthma exacerbation 06/20/2017    Past Surgical History:  Procedure Laterality Date  . CESAREAN SECTION       OB History   No obstetric history on file.      Home Medications    Prior to Admission medications     Medication Sig Start Date End Date Taking? Authorizing Provider  acetaminophen (TYLENOL) 500 MG tablet Take 2 tablets (1,000 mg total) by mouth every 8 (eight) hours. 08/12/18   Adam PhenixSimaan, Elizabeth S, PA-C  albuterol (PROVENTIL HFA;VENTOLIN HFA) 108 (90 Base) MCG/ACT inhaler Inhale 1-2 puffs into the lungs every 6 (six) hours as needed for wheezing or shortness of breath. 11/03/18   Maxwell CaulLayden, Lindsey A, PA-C  azithromycin (ZITHROMAX) 250 MG tablet Take 1 tablet (250 mg total) by mouth daily. Take first 2 tablets together, then 1 every day until finished. 11/03/18   Maxwell CaulLayden, Lindsey A, PA-C  bisacodyl (DULCOLAX) 5 MG EC tablet Take 10 mg by mouth daily as needed for moderate constipation.    [provider]  clindamycin (CLEOCIN) 300 MG capsule Take 1 capsule (300 mg total) by mouth 3 (three) times daily for 10 days. 11/18/18 11/28/18  Donesha Wallander C, PA-C  docusate sodium (COLACE) 50 MG capsule Take 2 capsules (100 mg total) by mouth 2 (two) times daily. Patient not taking: Reported on 08/07/2018 06/24/17   Osvaldo ShipperKrishnan, Gokul, MD  ferrous sulfate 325 (65 FE) MG tablet Take 1 tablet (325 mg total) by mouth 2 (two) times daily with a meal. Patient not taking: Reported on 08/07/2018 06/24/17   Osvaldo ShipperKrishnan, Gokul, MD  fluticasone (FLOVENT DISKUS) 50 MCG/BLIST diskus inhaler Inhale 2 puffs into the lungs 2 (  two) times daily. Patient not taking: Reported on 08/07/2018 06/24/17   Osvaldo ShipperKrishnan, Gokul, MD  ibuprofen (ADVIL,MOTRIN) 200 MG tablet Take 800 mg by mouth every 6 (six) hours as needed for moderate pain.    [provider]  metroNIDAZOLE (FLAGYL) 500 MG tablet Take 1 tablet (500 mg total) by mouth 2 (two) times daily with a meal. DO NOT CONSUME ALCOHOL WHILE TAKING THIS MEDICATION. 08/12/18   Adam PhenixSimaan, Elizabeth S, PA-C  nicotine (NICODERM CQ - DOSED IN MG/24 HOURS) 14 mg/24hr patch Place 1 patch (14 mg total) onto the skin daily. Patient not taking: Reported on 08/07/2018 06/25/17   Osvaldo ShipperKrishnan, Gokul, MD   ondansetron (ZOFRAN ODT) 4 MG disintegrating tablet Take 1 tablet (4 mg total) by mouth every 8 (eight) hours as needed for nausea or vomiting. 11/03/18   Maxwell CaulLayden, Lindsey A, PA-C  ondansetron (ZOFRAN ODT) 4 MG disintegrating tablet Take 1 tablet (4 mg total) by mouth every 8 (eight) hours as needed for nausea or vomiting. 11/18/18   Kahleb Mcclane C, PA-C  oseltamivir (TAMIFLU) 75 MG capsule Take 1 capsule (75 mg total) by mouth every 12 (twelve) hours. 11/03/18   Maxwell CaulLayden, Lindsey A, PA-C    Family History Family History  Problem Relation Age of Onset  . Hypertension Mother   . Cancer Father     Social History Social History   Tobacco Use  . Smoking status: Current Every Day Smoker    Packs/day: 0.50    Types: Cigarettes  . Smokeless tobacco: Never Used  Substance Use Topics  . Alcohol use: Yes    Comment: occ  . Drug use: No     Allergies   Penicillins   Review of Systems Review of Systems  Constitutional: Negative for chills, diaphoresis and fever.  HENT: Positive for sore throat. Negative for congestion, drooling, trouble swallowing and voice change.   Respiratory: Negative for cough and shortness of breath.   Cardiovascular: Negative for chest pain.  Gastrointestinal: Negative for abdominal pain, diarrhea, nausea and vomiting.  Musculoskeletal: Positive for neck pain. Negative for neck stiffness.  Neurological: Negative for dizziness, weakness, light-headedness, numbness and headaches.  All other systems reviewed and are negative.    Physical Exam Updated Vital Signs BP (!) 161/89 (BP Location: Left Arm)   Pulse 96   Temp 98.8 F (37.1 C) (Oral)   Resp 18   Ht 5\' 10"  (1.778 m)   Wt (!) 177.2 kg   LMP 11/08/2018   SpO2 99%   BMI 56.06 kg/m   Physical Exam Vitals signs and nursing note reviewed.  Constitutional:      General: She is not in acute distress.    Appearance: She is well-developed. She is not diaphoretic.  HENT:     Head: Normocephalic and  atraumatic.     Comments: Patient is swelling and tenderness to the bilateral submental and submandibular regions.  Tenderness extends into the right side of the neck between the trachea and the SCM.  She also has tenderness to the right side of the face, particularly along the mandibular line.  Dentition appears to be grossly intact, however, there is a right-sided maxillary tooth in the premolar region that has erosion.  No surrounding tenderness, swelling, or fluctuance.  No facial swelling.  No trismus.  Tolerating oral secretions without noted difficulty.    Right Ear: Tympanic membrane, ear canal and external ear normal.     Left Ear: Tympanic membrane, ear canal and external ear normal.  Nose: Nose normal.     Mouth/Throat:     Mouth: Mucous membranes are moist.     Pharynx: Oropharynx is clear.  Eyes:     Conjunctiva/sclera: Conjunctivae normal.  Neck:     Musculoskeletal: Normal range of motion and neck supple. No neck rigidity.  Cardiovascular:     Rate and Rhythm: Normal rate and regular rhythm.     Pulses: Normal pulses.     Heart sounds: Normal heart sounds.  Pulmonary:     Effort: Pulmonary effort is normal. No respiratory distress.     Breath sounds: Normal breath sounds.  Abdominal:     Palpations: Abdomen is soft.     Tenderness: There is no abdominal tenderness. There is no guarding.  Musculoskeletal:     Right lower leg: No edema.     Left lower leg: No edema.  Lymphadenopathy:     Cervical: Cervical adenopathy present.  Skin:    General: Skin is warm and dry.  Neurological:     Mental Status: She is alert.  Psychiatric:        Mood and Affect: Mood and affect normal.        Speech: Speech normal.        Behavior: Behavior normal.      ED Treatments / Results  Labs (all labs ordered are listed, but only abnormal results are displayed) Labs Reviewed  GROUP A STREP BY PCR - Abnormal; Notable for the following components:      Result Value   Group A  Strep by PCR DETECTED (*)    All other components within normal limits  CBC WITH DIFFERENTIAL/PLATELET - Abnormal; Notable for the following components:   WBC 15.8 (*)    Hemoglobin 11.7 (*)    MCV 78.5 (*)    MCH 23.0 (*)    MCHC 29.3 (*)    RDW 18.0 (*)    Neutro Abs 9.4 (*)    Lymphs Abs 4.8 (*)    All other components within normal limits  BASIC METABOLIC PANEL  PREGNANCY, URINE  MONONUCLEOSIS SCREEN  I-STAT CG4 LACTIC ACID, ED  I-STAT CG4 LACTIC ACID, ED    EKG None  Radiology Ct Soft Tissue Neck W Contrast  Result Date: 11/18/2018 CLINICAL DATA:  Sore throat stridor EXAM: CT NECK WITH CONTRAST TECHNIQUE: Multidetector CT imaging of the neck was performed using the standard protocol following the bolus administration of intravenous contrast. CONTRAST:  75mL ISOVUE-300 IOPAMIDOL (ISOVUE-300) INJECTION 61% COMPARISON:  None. FINDINGS: Pharynx and larynx: Enlargement of the adenoid bilaterally. Mild hypertrophy of the palatine tonsils. No abscess. Marked enlargement of the lingual tonsil bilaterally. Normal vocal cords. Salivary glands: No inflammation, mass, or stone. Thyroid: Negative Lymph nodes: Scattered small lymph nodes in the neck bilaterally appear reactive. No pathologic lymph nodes. Vascular: Normal vascular enhancement. Limited intracranial: Negative Visualized orbits: Negative Mastoids and visualized paranasal sinuses: Negative Skeleton: No acute skeletal abnormality. Upper chest: Lung apices clear bilaterally. Other: None IMPRESSION: Findings compatible with pharyngitis. Enlargement of the adenoid, palatine tonsil and marked enlargement of the lingual tonsil bilaterally. Airway narrowing is present due to the lingual tonsil enlargement. Electronically Signed   By: Marlan Palau M.D.   On: 11/18/2018 16:37    Procedures Procedures (including critical care time)  Medications Ordered in ED Medications  iopamidol (ISOVUE-300) 61 % injection 100 mL (75 mLs Intravenous  Contrast Given 11/18/18 1617)  sodium chloride 0.9 % bolus 1,000 mL (0 mLs Intravenous Stopped 11/18/18 1745)  dexamethasone (  DECADRON) injection 10 mg (10 mg Intravenous Given 11/18/18 1638)     Initial Impression / Assessment and Plan / ED Course  I have reviewed the triage vital signs and the nursing notes.  Pertinent labs & imaging results that were available during my care of the patient were reviewed by me and considered in my medical decision making (see chart for details).  Clinical Course as of Nov 18 1898  Wed Nov 18, 2018  1707 Spoke with Dr. Jearld Fenton, ENT.  We discussed CT findings and patient's presentation.  States there is nothing additional that needs to be done. Mono is a consideration, but she may be put on another course of antibiotics, such as clindamycin should this be persistent strep pharyngitis.  For stable or improving symptoms, PCP follow-up is sufficient.   [SJ]    Clinical Course User Index [SJ] Messiah Ahr C, PA-C    Patient presents with throat pain. Patient is nontoxic appearing, afebrile, not tachycardic, not tachypneic, not hypotensive, maintains excellent SPO2 on room air, and is in no apparent distress.  Handling oral secretions without difficulty. CT results consistent with pharyngitis with adenopathy.  We will initiate a different course of antibiotics and have her follow-up with her PCP. Tolerating PO here in ED. The patient was given instructions for home care as well as return precautions. Patient voices understanding of these instructions, accepts the plan, and is comfortable with discharge.   Findings and plan of care discussed with Alvira Monday, MD. Dr. Dalene Seltzer personally evaluated and examined this patient.  Vitals:   11/18/18 1345 11/18/18 1629  BP: (!) 161/89 (!) 131/104  Pulse: 96 89  Resp: 18 18  Temp: 98.8 F (37.1 C)   TempSrc: Oral   SpO2: 99% 100%  Weight: (!) 177.2 kg   Height: 5\' 10"  (1.778 m)      Final Clinical  Impressions(s) / ED Diagnoses   Final diagnoses:  Pharyngitis due to Streptococcus species    ED Discharge Orders         Ordered    ondansetron (ZOFRAN ODT) 4 MG disintegrating tablet  Every 8 hours PRN     11/18/18 1736    clindamycin (CLEOCIN) 300 MG capsule  3 times daily    Note to Pharmacy:  May be dispensed as 150 mg capsules if more economical for the patient.   11/18/18 1736           Anselm Pancoast, PA-C 11/18/18 1904    Alvira Monday, MD 11/20/18 1940

## 2018-11-18 NOTE — Discharge Instructions (Signed)
Sore Throat  You have been seen today for sore throat.  The strep test was still positive.   We will reinitiate antibiotic therapy.  However, viral diagnoses, such as mono are possibilities.   Viral infections do not respond to antibiotics.  Your body has to fight off the infection and it needs to run its course.  Please take all of your antibiotics until finished!   You may develop abdominal discomfort or diarrhea from the antibiotic.  You may help offset this with probiotics which you can buy or get in yogurt. Do not eat or take the probiotics until 2 hours after your antibiotic.   Hand washing: Wash your hands throughout the day, but especially before and after touching the face, using the restroom, sneezing, coughing, or touching surfaces that have been coughed or sneezed upon. Hydration: Symptoms will be intensified and complicated by dehydration. Dehydration can also extend the duration of symptoms. Drink plenty of fluids and get plenty of rest. You should be drinking at least half a liter of water an hour to stay hydrated. Electrolyte drinks (ex. Gatorade, Powerade, Pedialyte) are also encouraged. You should be drinking enough fluids to make your urine light yellow, almost clear. If this is not the case, you are not drinking enough water. Please note that some of the treatments indicated below will not be effective if you are not adequately hydrated. Diet: Please concentrate on hydration, however, you may introduce food slowly.  Start with a clear liquid diet, progressed to a full liquid diet, and then bland solids as you are able. Pain or fever: Ibuprofen, Naproxen, or Tylenol for pain or fever. (see below for suggested regimen) Antiinflammatory medications: Take 600 mg of ibuprofen every 6 hours or 440 mg (over the counter dose) to 500 mg (prescription dose) of naproxen every 12 hours for the next 3 days. After this time, these medications may be used as needed for pain. Take these medications  with food to avoid upset stomach. Choose only one of these medications, do not take them together. Tylenol: Should you continue to have additional pain while taking the ibuprofen or naproxen, you may add in tylenol as needed. Your daily total maximum amount of tylenol from all sources should be limited to 4000mg /day for persons without liver problems, or 2000mg /day for those with liver problems. Sore throat: Warm liquids or Chloraseptic spray may help soothe a sore throat. Gargle twice a day with a salt water solution made from a half teaspoon of salt in a cup of warm water.  Follow up: Follow up with a primary care provider, as needed, for any future management of this issue.  For prescription assistance, may try using prescription discount sites or apps, such as goodrx.com

## 2018-11-18 NOTE — ED Triage Notes (Addendum)
Pt c/o right earache, cont'd swelling to glands-recent dx of strep throat with rx abx-NAD-steddy gait

## 2019-04-22 ENCOUNTER — Emergency Department (HOSPITAL_BASED_OUTPATIENT_CLINIC_OR_DEPARTMENT_OTHER): Payer: Medicaid Other

## 2019-04-22 ENCOUNTER — Other Ambulatory Visit: Payer: Self-pay

## 2019-04-22 ENCOUNTER — Encounter (HOSPITAL_BASED_OUTPATIENT_CLINIC_OR_DEPARTMENT_OTHER): Payer: Self-pay | Admitting: Emergency Medicine

## 2019-04-22 ENCOUNTER — Emergency Department (HOSPITAL_BASED_OUTPATIENT_CLINIC_OR_DEPARTMENT_OTHER)
Admission: EM | Admit: 2019-04-22 | Discharge: 2019-04-22 | Disposition: A | Discharge status: Home / Self Care | Payer: Medicaid Other | Attending: Emergency Medicine | Admitting: Emergency Medicine

## 2019-04-22 DIAGNOSIS — I10 Essential (primary) hypertension: Secondary | ICD-10-CM | POA: Diagnosis not present

## 2019-04-22 DIAGNOSIS — Z79899 Other long term (current) drug therapy: Secondary | ICD-10-CM | POA: Insufficient documentation

## 2019-04-22 DIAGNOSIS — F17210 Nicotine dependence, cigarettes, uncomplicated: Secondary | ICD-10-CM | POA: Insufficient documentation

## 2019-04-22 DIAGNOSIS — K654 Sclerosing mesenteritis: Secondary | ICD-10-CM | POA: Insufficient documentation

## 2019-04-22 DIAGNOSIS — J45909 Unspecified asthma, uncomplicated: Secondary | ICD-10-CM | POA: Diagnosis not present

## 2019-04-22 DIAGNOSIS — R1013 Epigastric pain: Secondary | ICD-10-CM | POA: Diagnosis present

## 2019-04-22 LAB — COMPREHENSIVE METABOLIC PANEL
ALT: 16 U/L (ref 0–44)
AST: 19 U/L (ref 15–41)
Albumin: 3.3 g/dL — ABNORMAL LOW (ref 3.5–5.0)
Alkaline Phosphatase: 75 U/L (ref 38–126)
Anion gap: 9 (ref 5–15)
BUN: 14 mg/dL (ref 6–20)
CO2: 24 mmol/L (ref 22–32)
Calcium: 8.5 mg/dL — ABNORMAL LOW (ref 8.9–10.3)
Chloride: 106 mmol/L (ref 98–111)
Creatinine, Ser: 0.8 mg/dL (ref 0.44–1.00)
GFR calc Af Amer: >60 mL/min (ref >60)
GFR calc non Af Amer: >60 mL/min (ref >60)
Glucose, Bld: 131 mg/dL — ABNORMAL HIGH (ref 70–99)
Potassium: 3.7 mmol/L (ref 3.5–5.1)
Sodium: 139 mmol/L (ref 135–145)
Total Bilirubin: 0.3 mg/dL (ref 0.3–1.2)
Total Protein: 6.7 g/dL (ref 6.5–8.1)

## 2019-04-22 LAB — CBC WITH DIFFERENTIAL/PLATELET
Abs Immature Granulocytes: 0.03 10*3/uL (ref 0.00–0.07)
Basophils Absolute: 0 10*3/uL (ref 0.0–0.1)
Basophils Relative: 0 %
Eosinophils Absolute: 0.2 10*3/uL (ref 0.0–0.5)
Eosinophils Relative: 2 %
HCT: 37 % (ref 36.0–46.0)
Hemoglobin: 11.1 g/dL — ABNORMAL LOW (ref 12.0–15.0)
Immature Granulocytes: 0 %
Lymphocytes Relative: 32 %
Lymphs Abs: 4.5 10*3/uL — ABNORMAL HIGH (ref 0.7–4.0)
MCH: 24.4 pg — ABNORMAL LOW (ref 26.0–34.0)
MCHC: 30 g/dL (ref 30.0–36.0)
MCV: 81.3 fL (ref 80.0–100.0)
Monocytes Absolute: 0.8 10*3/uL (ref 0.1–1.0)
Monocytes Relative: 6 %
Neutro Abs: 8.6 10*3/uL — ABNORMAL HIGH (ref 1.7–7.7)
Neutrophils Relative %: 60 %
Platelets: 312 10*3/uL (ref 150–400)
RBC: 4.55 MIL/uL (ref 3.87–5.11)
RDW: 16.7 % — ABNORMAL HIGH (ref 11.5–15.5)
Smear Review: NORMAL
WBC: 14.2 10*3/uL — ABNORMAL HIGH (ref 4.0–10.5)
nRBC: 0 % (ref 0.0–0.2)

## 2019-04-22 LAB — LIPASE, BLOOD: Lipase: 30 U/L (ref 11–51)

## 2019-04-22 MED ORDER — ONDANSETRON HCL 4 MG/2ML IJ SOLN
4.0000 mg | Freq: Once | INTRAMUSCULAR | Status: AC
  Administered 2019-04-22: 05:00:00 4 mg via INTRAVENOUS
  Filled 2019-04-22: qty 2

## 2019-04-22 MED ORDER — PREDNISONE 20 MG PO TABS: 40.0000 mg | ORAL_TABLET | Freq: Every day | ORAL | 0 refills | Status: DC

## 2019-04-22 MED ORDER — METHYLPREDNISOLONE SODIUM SUCC 125 MG IJ SOLR
125.0000 mg | Freq: Once | INTRAMUSCULAR | Status: AC
  Administered 2019-04-22: 125 mg via INTRAVENOUS
  Filled 2019-04-22: qty 2

## 2019-04-22 MED ORDER — FENTANYL CITRATE (PF) 100 MCG/2ML IJ SOLN
100.0000 ug | Freq: Once | INTRAMUSCULAR | Status: AC
  Administered 2019-04-22: 100 ug via INTRAVENOUS
  Filled 2019-04-22: qty 2

## 2019-04-22 MED ORDER — HYDROCODONE-ACETAMINOPHEN 5-325 MG PO TABS: 1.0000 | ORAL_TABLET | ORAL | 0 refills | Status: DC | PRN

## 2019-04-22 MED ORDER — IOHEXOL 300 MG/ML  SOLN
100.0000 mL | Freq: Once | INTRAMUSCULAR | Status: AC | PRN
  Administered 2019-04-22: 06:00:00 100 mL via INTRAVENOUS

## 2019-04-22 NOTE — Discharge Instructions (Signed)
Please follow-up with Audria Nine, FNP on Monday as scheduled.  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  You were started on prednisone, 40 mg daily.

## 2019-04-22 NOTE — ED Provider Notes (Signed)
[facility] Provider Note: Morgan DellJ. Lane Zionna Homewood, MD, FACEP  CSN: [id number] MRN: [id number] ARRIVAL: 04/22/19 at 0412 ROOM: [facility]   CHIEF COMPLAINT  Abdominal Pain   HISTORY OF PRESENT ILLNESS  04/22/19 4:30 AM Morgan Bryant is a 38 y.o. female with history of morbid obesity and diverticulitis.  She is here with abdominal pain in the epigastrium that is been present for about 2 weeks.  She describes the pain is like previous diverticulitis.  The pain worsened overnight and is now more diffuse and present in the lower abdomen from left to right.  She describes her pain as a sensation that she is going to explode.  She rates her pain as a 15 out of 10.  Pain is worse with movement or palpation of the epigastrium.  She has had some intermittent nausea but no vomiting or diarrhea.  She has not had a fever.   Past Medical History:  Diagnosis Date   Anemia    Asthma    Hypertension    Morbid obesity (HCC)    Sleep apnea     Past Surgical History:  Procedure Laterality Date   CESAREAN SECTION      Family History  Problem Relation Age of Onset   Hypertension Mother    Cancer Father     Social History   Tobacco Use   Smoking status: Current Every Day Smoker    Packs/day: 0.50    Types: Cigarettes   Smokeless tobacco: Never Used  Substance Use Topics   Alcohol use: Yes    Comment: occ   Drug use: No    Prior to Admission medications   Medication Sig Start Date End Date Taking? Authorizing Provider  acetaminophen (TYLENOL) 500 MG tablet Take 2 tablets (1,000 mg total) by mouth every 8 (eight) hours. 08/12/18   Adam PhenixSimaan, Elizabeth S, PA-C  albuterol (PROVENTIL HFA;VENTOLIN HFA) 108 (90 Base) MCG/ACT inhaler Inhale 1-2 puffs into the lungs every 6 (six) hours as needed for wheezing or shortness of breath. 11/03/18   Maxwell CaulLayden, Lindsey A, PA-C  azithromycin (ZITHROMAX) 250 MG tablet Take 1 tablet (250 mg total) by mouth daily. Take first 2 tablets together,  then 1 every day until finished. 11/03/18   Maxwell CaulLayden, Lindsey A, PA-C  bisacodyl (DULCOLAX) 5 MG EC tablet Take 10 mg by mouth daily as needed for moderate constipation.    [provider]  ibuprofen (ADVIL,MOTRIN) 200 MG tablet Take 800 mg by mouth every 6 (six) hours as needed for moderate pain.    [provider]  metroNIDAZOLE (FLAGYL) 500 MG tablet Take 1 tablet (500 mg total) by mouth 2 (two) times daily with a meal. DO NOT CONSUME ALCOHOL WHILE TAKING THIS MEDICATION. 08/12/18   Adam PhenixSimaan, Elizabeth S, PA-C  ondansetron (ZOFRAN ODT) 4 MG disintegrating tablet Take 1 tablet (4 mg total) by mouth every 8 (eight) hours as needed for nausea or vomiting. 11/03/18   Maxwell CaulLayden, Lindsey A, PA-C  ondansetron (ZOFRAN ODT) 4 MG disintegrating tablet Take 1 tablet (4 mg total) by mouth every 8 (eight) hours as needed for nausea or vomiting. 11/18/18   Joy, Shawn C, PA-C  oseltamivir (TAMIFLU) 75 MG capsule Take 1 capsule (75 mg total) by mouth every 12 (twelve) hours. 11/03/18   Maxwell CaulLayden, Lindsey A, PA-C  ferrous sulfate 325 (65 FE) MG tablet Take 1 tablet (325 mg total) by mouth 2 (two) times daily with a meal. Patient not taking: Reported on 08/07/2018 06/24/17 04/22/19  Osvaldo ShipperKrishnan, Gokul, MD  fluticasone (FLOVENT DISKUS) 50 MCG/BLIST diskus inhaler Inhale 2 puffs into the lungs 2 (two) times daily. Patient not taking: Reported on 08/07/2018 06/24/17 04/22/19  Osvaldo ShipperKrishnan, Gokul, MD    Allergies Penicillins   REVIEW OF SYSTEMS  Negative except as noted here or in the History of Present Illness.   PHYSICAL EXAMINATION  Initial Vital Signs Blood pressure (!) 161/105, pulse (!) 101, temperature 98.3 F (36.8 C), temperature source Oral, resp. rate 18, height 5\' 10"  (1.778 m), weight (!) 172.4 kg, last menstrual period 04/05/2019, SpO2 98 %.  Examination General: Well-developed, morbidly obese female in no acute distress; appearance consistent with age of record HENT: normocephalic; atraumatic Eyes:  Normal appearance Neck: supple Heart: regular rate and rhythm; no murmurs, rubs or gallops Lungs: clear to auscultation bilaterally Abdomen: soft; obese; epigastric tenderness; bowel sounds present Extremities: No deformity; full range of motion; pulses normal Neurologic: Awake, alert and oriented; motor function intact in all extremities and symmetric; no facial droop Skin: Warm and dry Psychiatric: Grimacing   RESULTS  Summary of this visit's results, reviewed by myself:   EKG Interpretation  Date/Time:    Ventricular Rate:    PR Interval:    QRS Duration:   QT Interval:    QTC Calculation:   R Axis:     Text Interpretation:        Laboratory Studies: Results for orders placed or performed during the hospital encounter of 04/22/19 (from the past 24 hour(s))  CBC with Differential/Platelet     Status: Abnormal   Collection Time: 04/22/19  4:38 AM  Result Value Ref Range   WBC 14.2 (H) 4.0 - 10.5 K/uL   RBC 4.55 3.87 - 5.11 MIL/uL   Hemoglobin 11.1 (L) 12.0 - 15.0 g/dL   HCT 16.137.0 09.636.0 - 04.546.0 %   MCV 81.3 80.0 - 100.0 fL   MCH 24.4 (L) 26.0 - 34.0 pg   MCHC 30.0 30.0 - 36.0 g/dL   RDW 40.916.7 (H) 81.111.5 - 91.415.5 %   Platelets 312 150 - 400 K/uL   nRBC 0.0 0.0 - 0.2 %   Neutrophils Relative % 60 %   Neutro Abs 8.6 (H) 1.7 - 7.7 K/uL   Lymphocytes Relative 32 %   Lymphs Abs 4.5 (H) 0.7 - 4.0 K/uL   Monocytes Relative 6 %   Monocytes Absolute 0.8 0.1 - 1.0 K/uL   Eosinophils Relative 2 %   Eosinophils Absolute 0.2 0.0 - 0.5 K/uL   Basophils Relative 0 %   Basophils Absolute 0.0 0.0 - 0.1 K/uL   WBC Morphology MORPHOLOGY UNREMARKABLE    Smear Review Normal platelet morphology    Immature Granulocytes 0 %   Abs Immature Granulocytes 0.03 0.00 - 0.07 K/uL   Stomatocytes PRESENT   Comprehensive metabolic panel     Status: Abnormal   Collection Time: 04/22/19  4:38 AM  Result Value Ref Range   Sodium 139 135 - 145 mmol/L   Potassium 3.7 3.5 - 5.1 mmol/L   Chloride 106 98  - 111 mmol/L   CO2 24 22 - 32 mmol/L   Glucose, Bld 131 (H) 70 - 99 mg/dL   BUN 14 6 - 20 mg/dL   Creatinine, Ser 7.820.80 0.44 - 1.00 mg/dL   Calcium 8.5 (L) 8.9 - 10.3 mg/dL   Total Protein 6.7 6.5 - 8.1 g/dL   Albumin 3.3 (L) 3.5 - 5.0 g/dL   AST 19 15 - 41 U/L   ALT 16 0 - 44 U/L  Alkaline Phosphatase 75 38 - 126 U/L   Total Bilirubin 0.3 0.3 - 1.2 mg/dL   GFR calc non Af Amer >60 >60 mL/min   GFR calc Af Amer >60 >60 mL/min   Anion gap 9 5 - 15  Lipase, blood     Status: None   Collection Time: 04/22/19  4:38 AM  Result Value Ref Range   Lipase 30 11 - 51 U/L   Imaging Studies: Ct Abdomen Pelvis W Contrast  Result Date: 04/22/2019 CLINICAL DATA:  Abdominal pain EXAM: CT ABDOMEN AND PELVIS WITH CONTRAST TECHNIQUE: Multidetector CT imaging of the abdomen and pelvis was performed using the standard protocol following bolus administration of intravenous contrast. CONTRAST:  162mL OMNIPAQUE IOHEXOL 300 MG/ML  SOLN COMPARISON:  08/07/2018 FINDINGS: Lower chest:  No contributory findings. Hepatobiliary: No focal liver abnormality.No evidence of biliary obstruction or stone. Pancreas: Unremarkable. Spleen: Unremarkable. Adrenals/Urinary Tract: Negative adrenals. No hydronephrosis or stone. Unremarkable bladder. Stomach/Bowel: No obstruction. No appendicitis. Left colonic diverticulosis without superimposed inflammation Vascular/Lymphatic: No acute vascular abnormality. Fat stranding at the root of the small bowel mesentery without underlying adenopathy, mass, or scirrhous change. Reproductive:Negative. Other: No ascites or pneumoperitoneum. Musculoskeletal: No acute abnormalities. Premature degeneration with disc, facet, and sacroiliac vacuum phenomenon. IMPRESSION: 1. Mesenteric panniculitis. 2. Sigmoid diverticulosis. Electronically Signed   By: Monte Fantasia M.D.   On: 04/22/2019 [facility]    ED COURSE and MDM  Nursing notes and initial vitals signs, including pulse oximetry,  reviewed.  Vitals:   04/22/19 0422  BP: (!) 161/105  Pulse: (!) 101  Resp: 18  Temp: 98.3 F (36.8 C)  TempSrc: Oral  SpO2: 98%  Weight: (!) 172.4 kg  Height: 5\' 10"  (1.778 m)   Morgan Bryant was evaluated in Emergency Department on 04/22/2019 for the symptoms described in the history of present illness. She was evaluated in the context of the global COVID-19 pandemic, which necessitated consideration that the patient might be at risk for infection with the SARS-CoV-2 virus that causes COVID-19. Institutional protocols and algorithms that pertain to the evaluation of patients at risk for COVID-19 are in a state of rapid change based on information released by regulatory bodies including the CDC and federal and state organizations. These policies and algorithms were followed during the patient's care in the ED.  6:25 AM Patient's back pain down to a 3 out of 10 after 100 mcg of fentanyl IV.  CT scan consistent with mesenteric panniculitis, a rare condition thought to be autoimmune.  Standard therapies include anti-inflammatories, usually starting with corticosteroids.  We will start her on a course of prednisone and refer her to her primary care physician, Morgan Nine, FNP; she has an appointment scheduled with her in 4 days.  The patient was advised that corticosteroids can cause hyperglycemia/diabetes and given her body habitus she needs to be vigilant of polyuria, polydipsia and to get reevaluated if these occur.  We will not start her on antihypertensives at this point given her close follow-up but she was advised that her blood pressure is elevated.  PROCEDURES    ED DIAGNOSES     ICD-10-CM   1. Mesenteric panniculitis ([place])  K65.4        Natallie Ravenscroft, Jenny Reichmann, MD 04/22/19 [phone]

## 2019-04-22 NOTE — ED Notes (Signed)
Patient states she has had tubal ligation and period within last 28 days and is willing to have CT. Radiology and Provider aware.

## 2019-04-22 NOTE — ED Triage Notes (Signed)
Patient arrived via POV c/o abdominal pain in all quadrants. Patient states "it feels like it's going to explode." Patient states it's radiating to her back as well. Patient endorses hx of diverticulitis and states that this is similar to previous episodes. Patient is AO x 4, VS stable.

## 2019-04-22 NOTE — ED Notes (Signed)
Patient transported to CT 

## 2020-01-20 IMAGING — CT CT ABD-PELV W/ CM
2 of 4 series · 17 of 46 positions shown, 19 images · IV contrast (iopamidol)
Comparison: Patient's prior CT scan from July 22, 2005 is not
available on PACs for comparison.

CLINICAL DATA: Lower abdomen and pelvic pain.

EXAM:
CT ABDOMEN AND PELVIS WITH CONTRAST
TECHNIQUE: Multidetector CT imaging of the abdomen and pelvis was performed
using the standard protocol following bolus administration of
intravenous contrast.
CONTRAST:  100mL UXZMIC-YOO IOPAMIDOL (UXZMIC-YOO) INJECTION 61%

[Series 2: axial st · axial · 0.98mm/px · z∈[-481,-36]mm · 14 of 99 slices shown, 16 images]
[im 5/99  soft-tissue]
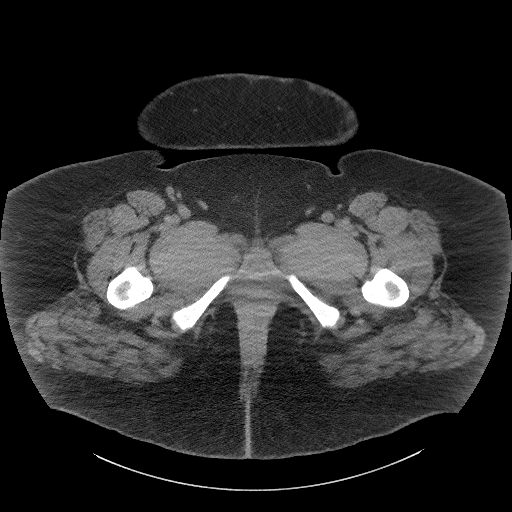
[im 5/99  bone]
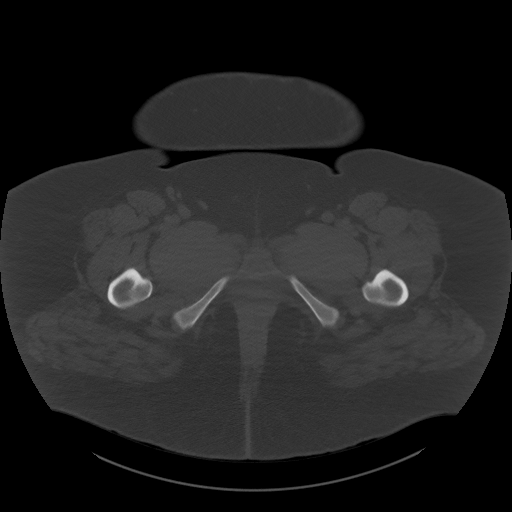
[im 13/99  soft-tissue]
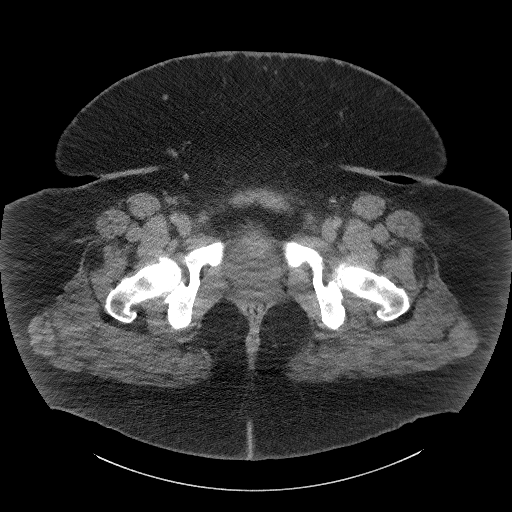
[im 21/99  soft-tissue]
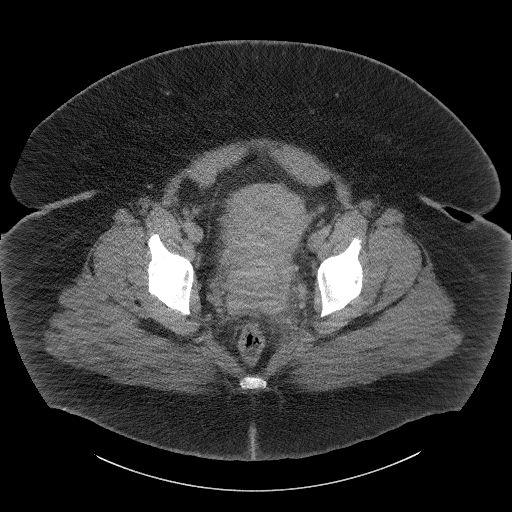
[im 25/99  soft-tissue]
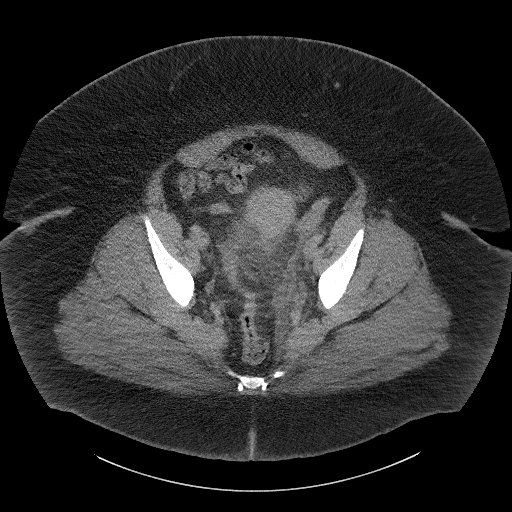
[im 33/99  soft-tissue]
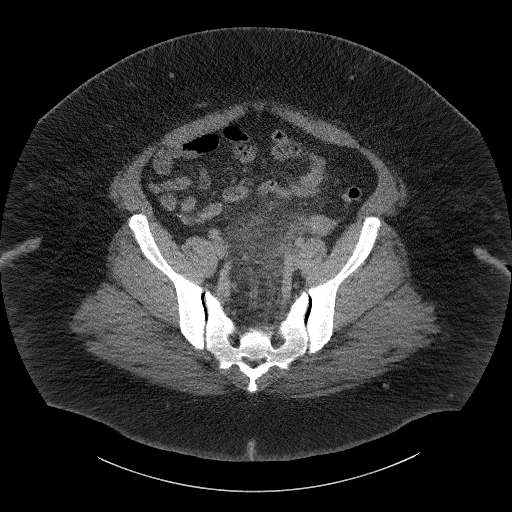
[im 41/99  soft-tissue]
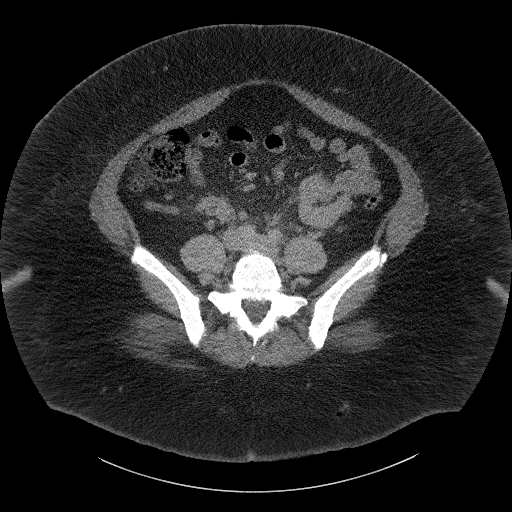
[im 45/99  soft-tissue]
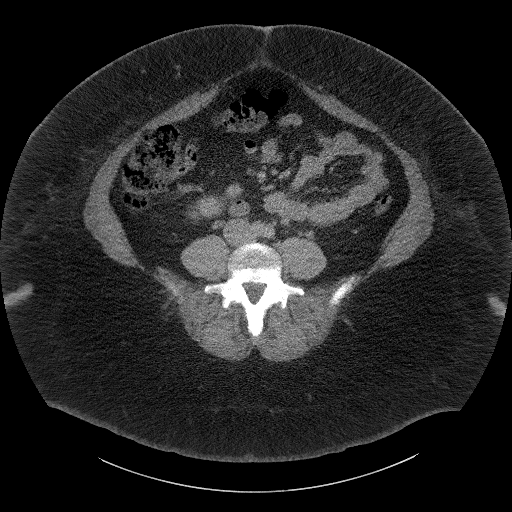
[im 54/99  soft-tissue]
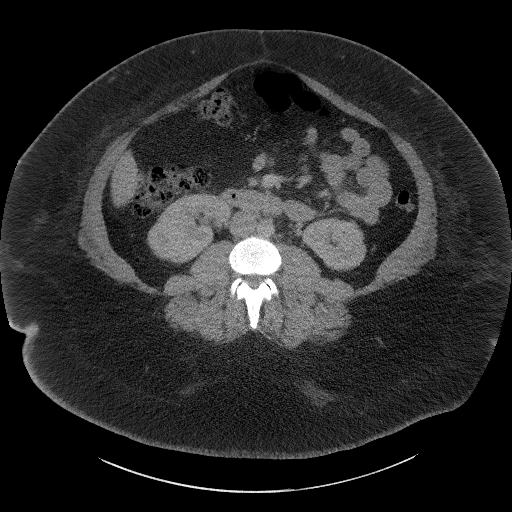
[im 58/99  soft-tissue]
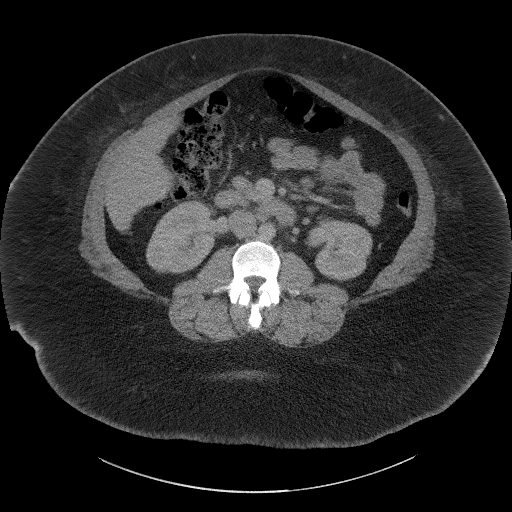
[im 58/99  bone]
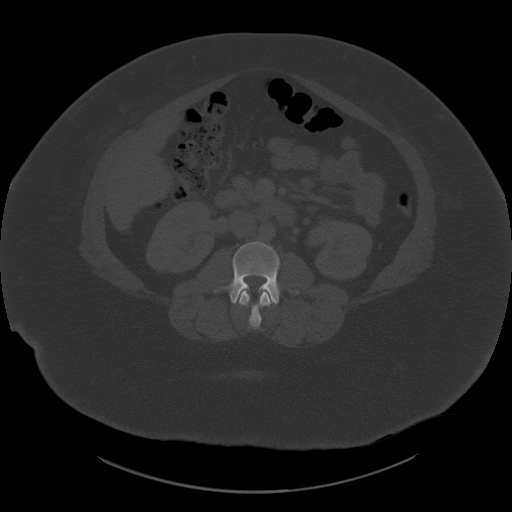
[im 66/99  soft-tissue]
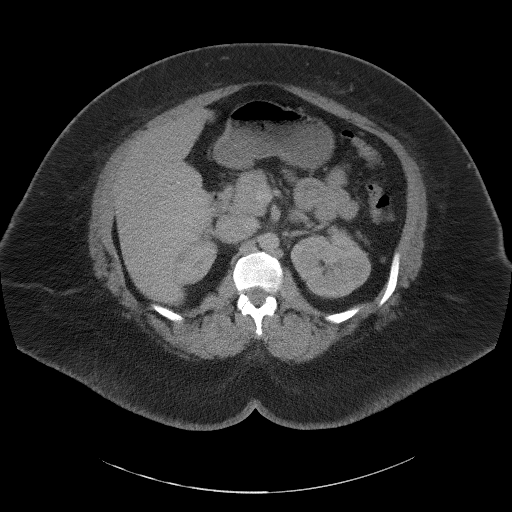
[im 74/99  soft-tissue]
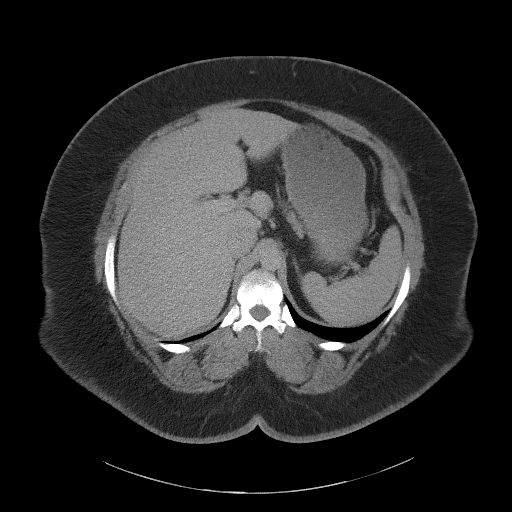
[im 78/99  soft-tissue]
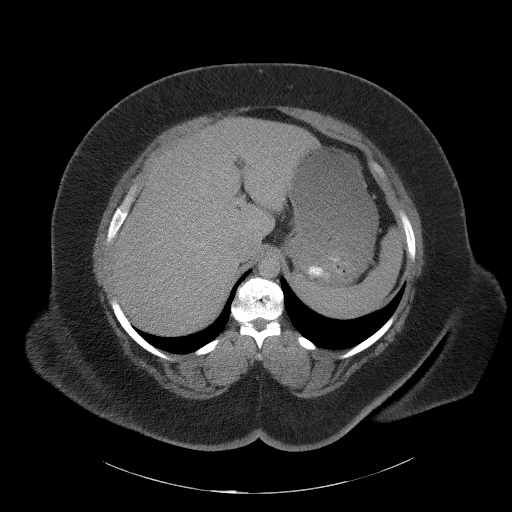
[im 86/99  soft-tissue]
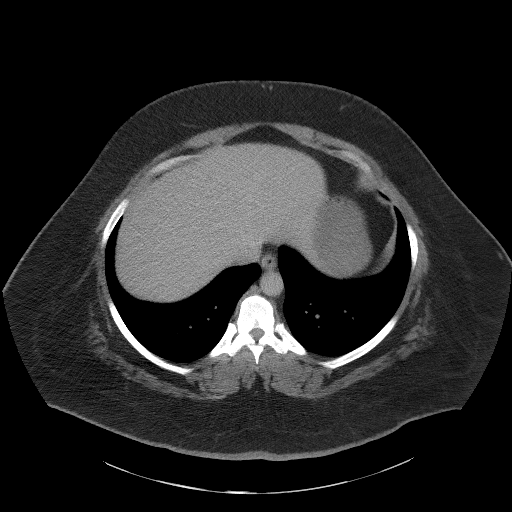
[im 94/99  soft-tissue]
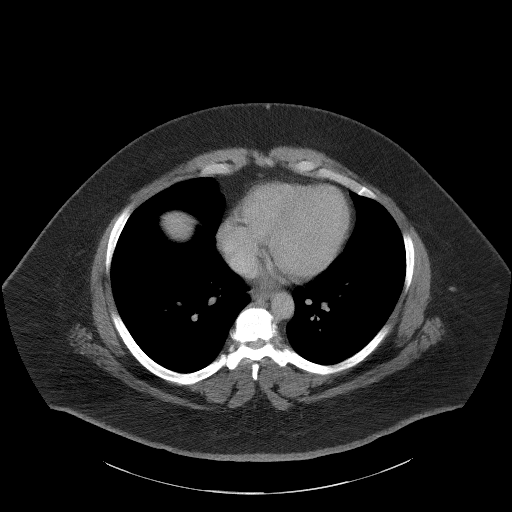

[Series 5: coronal st · coronal · 1.02mm/px · 3 of 111 slices shown]
[im 37/111  soft-tissue]
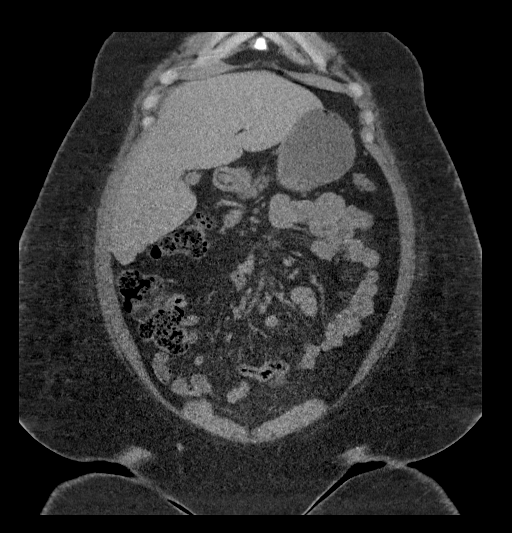
[im 49/111  soft-tissue]
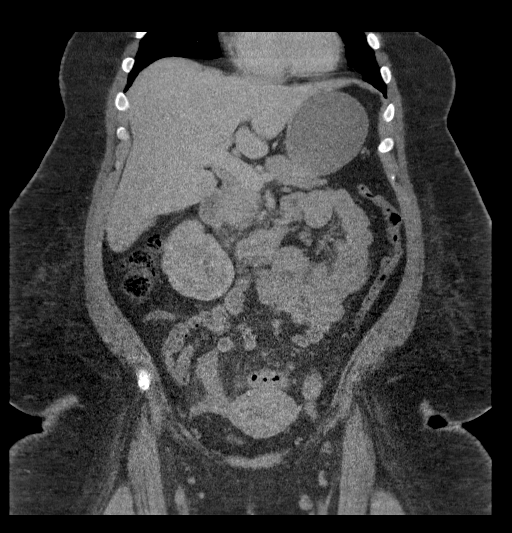
[im 62/111  soft-tissue]
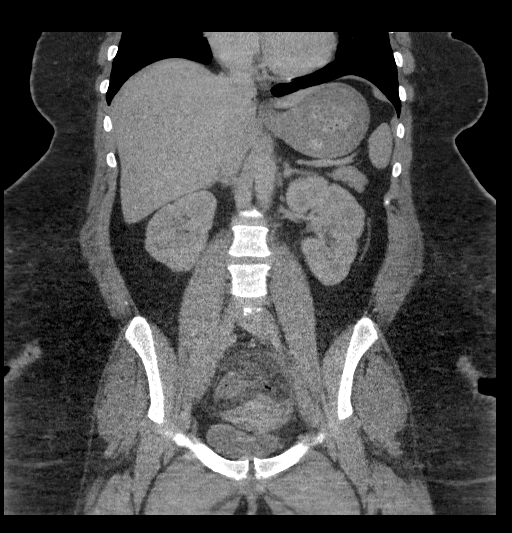

[17 of 46 positions shown; findings below may reference images not displayed]

FINDINGS: Lower chest: No acute abnormality.

Hepatobiliary: No focal liver abnormality is seen. No gallstones,
gallbladder wall thickening, or biliary dilatation.

Pancreas: Unremarkable. No pancreatic ductal dilatation or
surrounding inflammatory changes.

Spleen: Normal in size without focal abnormality.

Adrenals/Urinary Tract: Adrenal glands are unremarkable. Kidneys are
normal, without renal calculi, focal lesion, or hydronephrosis.
Bladder is unremarkable.

Stomach/Bowel: There is bowel wall thickening with surrounding
inflammation and small foci of micro focal perforation from acute
sigmoid diverticulitis. There is no bowel obstruction. The small
bowel is normal. The appendix is normal. The stomach is normal.

Vascular/Lymphatic: No significant vascular findings are present. No
enlarged abdominal or pelvic lymph nodes.

Reproductive: Uterus and bilateral adnexa are unremarkable.

Other: Small umbilical herniation of mesenteric fat is identified.

Musculoskeletal: Minimal degenerative joint changes of the spine are
noted.
IMPRESSION: Acute sigmoid diverticulitis with small foci of focal micro
perforation. No focal discrete abscess is noted.

## 2020-04-17 IMAGING — CR DG CHEST 2V
2 series · 2 of 2 positions shown · non-contrast
Comparison: 06/20/2017

CLINICAL DATA: Cough and weakness x2 days

EXAM:
CHEST - 2 VIEW

[w chest pa *]
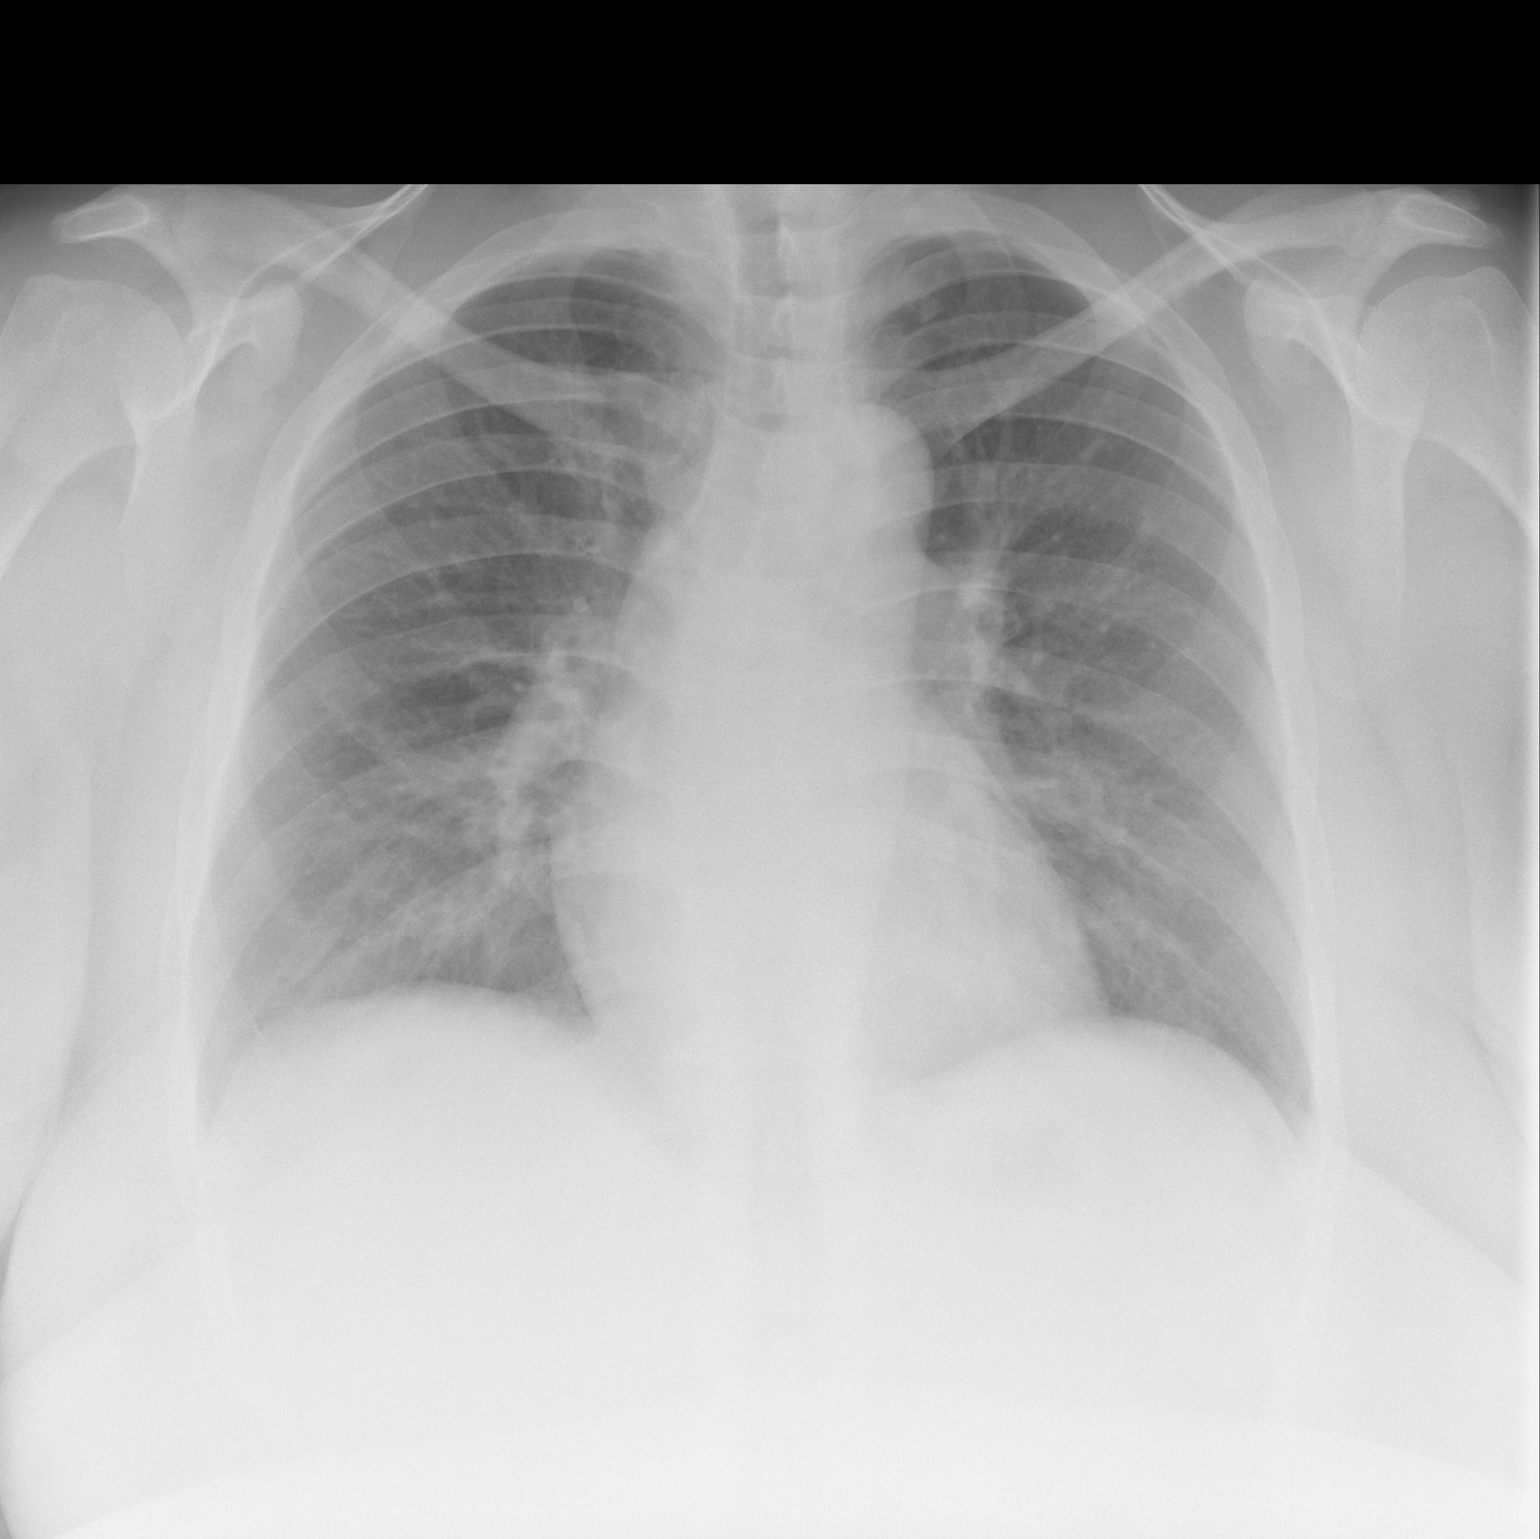

[w chest lat *]
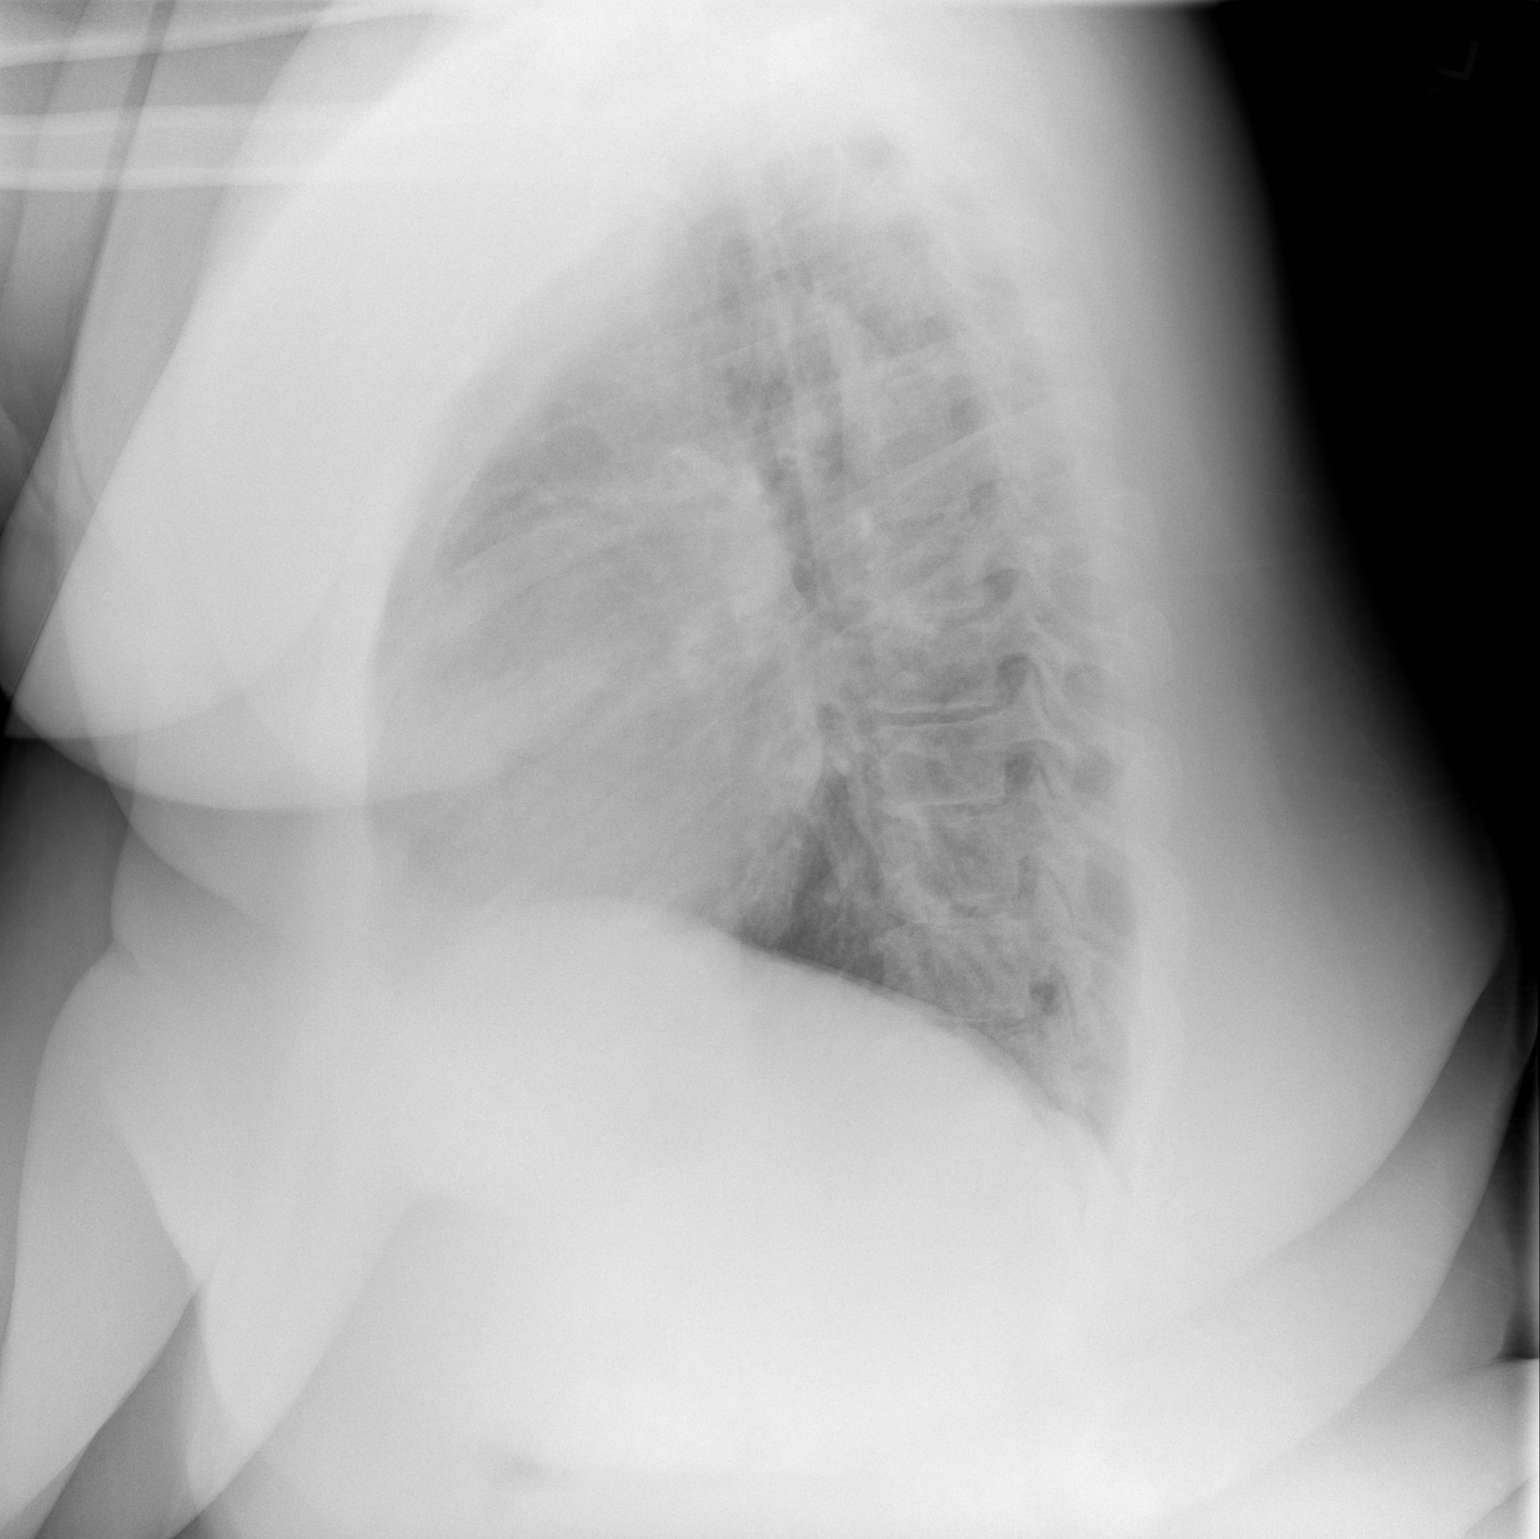

[2 of 2 positions shown; findings below may reference images not displayed]

FINDINGS: The heart size and mediastinal contours are within normal limits.
Mild increase in interstitial lung markings with peribronchial
thickening suspicious for small airway inflammation. No alveolar
consolidation. No effusion or pneumothorax. The visualized skeletal
structures are unremarkable.
IMPRESSION: Mild increase in interstitial lung markings with peribronchial
thickening, suspicious for small airway inflammation.

## 2020-11-11 ENCOUNTER — Other Ambulatory Visit: Payer: Self-pay

## 2020-11-11 ENCOUNTER — Encounter (HOSPITAL_BASED_OUTPATIENT_CLINIC_OR_DEPARTMENT_OTHER): Payer: Self-pay | Admitting: Emergency Medicine

## 2020-11-11 ENCOUNTER — Emergency Department (HOSPITAL_BASED_OUTPATIENT_CLINIC_OR_DEPARTMENT_OTHER)
Admission: EM | Admit: 2020-11-11 | Discharge: 2020-11-12 | Disposition: A | Discharge status: Home / Self Care | Payer: Medicaid Other | Attending: Emergency Medicine | Admitting: Emergency Medicine

## 2020-11-11 DIAGNOSIS — J45909 Unspecified asthma, uncomplicated: Secondary | ICD-10-CM | POA: Diagnosis not present

## 2020-11-11 DIAGNOSIS — Z7952 Long term (current) use of systemic steroids: Secondary | ICD-10-CM | POA: Diagnosis not present

## 2020-11-11 DIAGNOSIS — N83201 Unspecified ovarian cyst, right side: Secondary | ICD-10-CM | POA: Insufficient documentation

## 2020-11-11 DIAGNOSIS — F17210 Nicotine dependence, cigarettes, uncomplicated: Secondary | ICD-10-CM | POA: Diagnosis not present

## 2020-11-11 DIAGNOSIS — I10 Essential (primary) hypertension: Secondary | ICD-10-CM | POA: Insufficient documentation

## 2020-11-11 DIAGNOSIS — Z79899 Other long term (current) drug therapy: Secondary | ICD-10-CM | POA: Diagnosis not present

## 2020-11-11 DIAGNOSIS — R52 Pain, unspecified: Secondary | ICD-10-CM

## 2020-11-11 DIAGNOSIS — R1032 Left lower quadrant pain: Secondary | ICD-10-CM

## 2020-11-11 MED ORDER — ONDANSETRON HCL 4 MG/2ML IJ SOLN: 4.0000 mg | Freq: Once | INTRAMUSCULAR | Status: DC

## 2020-11-11 NOTE — ED Triage Notes (Signed)
Reports lower abdominalin for 3 days.  Also endorses some nausea but no vomiting or diarrhea.  Reports last period was last month and she only spotted.  Hx of BTL.

## 2020-11-11 NOTE — ED Provider Notes (Signed)
[facility] Provider Note: Lowella DellJ. Lane Arthur Speagle, MD, FACEP  CSN: [id number] MRN: [id number] ARRIVAL: 11/11/20 at 2302 ROOM: MH11/MH11   CHIEF COMPLAINT  Abdominal Pain   HISTORY OF PRESENT ILLNESS  11/11/20 11:25 PM Morgan Bryant is a 40 y.o. female who has had a crampy pain in her left lower abdomen for about a week that has worsened over the past 3 days. She also characterizes it as pressure-like. She rates it as an 8 out of 10 and finds it worse with movement especially ambulation. She has had associated nausea but no vomiting or diarrhea. She has had no fever. She has had no dysuria, vaginal bleeding or vaginal discharge. She has had a tubal ligation but her last period was lighter than usual.   Past Medical History:  Diagnosis Date   Anemia    Asthma    Hypertension    Morbid obesity (HCC)    Sleep apnea     Past Surgical History:  Procedure Laterality Date   CESAREAN SECTION      Family History  Problem Relation Age of Onset   Hypertension Mother    Cancer Father     Social History   Tobacco Use   Smoking status: Current Every Day Smoker    Packs/day: 0.50    Types: Cigarettes   Smokeless tobacco: Never Used  Vaping Use   Vaping Use: Former  Substance Use Topics   Alcohol use: Yes    Comment: occ   Drug use: No    Prior to Admission medications   Medication Sig Start Date End Date Taking? Authorizing Provider  albuterol (PROVENTIL HFA;VENTOLIN HFA) 108 (90 Base) MCG/ACT inhaler Inhale 1-2 puffs into the lungs every 6 (six) hours as needed for wheezing or shortness of breath. 11/03/18  Yes Graciella FreerLayden, Lindsey A, PA-C  amLODipine (NORVASC) 5 MG tablet Take 5 mg by mouth daily.   Yes [provider]  bisacodyl (DULCOLAX) 5 MG EC tablet Take 10 mg by mouth daily as needed for moderate constipation.    [provider]  HYDROcodone-acetaminophen (NORCO) 5-325 MG tablet Take 1 tablet by mouth every 4 (four) hours as needed (for  pain). 04/22/19   Kabella Cassidy, MD  predniSONE (DELTASONE) 20 MG tablet Take 2 tablets (40 mg total) by mouth daily. 04/22/19   Hamid Brookens, MD  ferrous sulfate 325 (65 FE) MG tablet Take 1 tablet (325 mg total) by mouth 2 (two) times daily with a meal. Patient not taking: Reported on 08/07/2018 06/24/17 04/22/19  Osvaldo ShipperKrishnan, Gokul, MD  fluticasone (FLOVENT DISKUS) 50 MCG/BLIST diskus inhaler Inhale 2 puffs into the lungs 2 (two) times daily. Patient not taking: Reported on 08/07/2018 06/24/17 04/22/19  Osvaldo ShipperKrishnan, Gokul, MD    Allergies Penicillins   REVIEW OF SYSTEMS  Negative except as noted here or in the History of Present Illness.   PHYSICAL EXAMINATION  Initial Vital Signs Blood pressure (!) 152/84, pulse (!) 104, temperature 98 F (36.7 C), temperature source Oral, resp. rate 18, height 5\' 10"  (1.778 m), weight (!) 199.4 kg, SpO2 99 %.  Examination General: Well-developed, obese female in no acute distress; appearance consistent with age of record HENT: normocephalic; atraumatic Eyes: pupils equal, round and reactive to light; extraocular muscles intact Neck: supple Heart: regular rate and rhythm Lungs: clear to auscultation bilaterally Abdomen: soft; nondistended; left lower quadrant and right paraumbilical tenderness; bowel sounds present Extremities: No deformity; full range of motion; pulses normal Neurologic: Awake, alert and oriented; motor function intact in  all extremities and symmetric; no facial droop Skin: Warm and dry Psychiatric: Normal mood and affect   RESULTS  Summary of this visit's results, reviewed and interpreted by myself:   EKG Interpretation  Date/Time:    Ventricular Rate:    PR Interval:    QRS Duration:   QT Interval:    QTC Calculation:   R Axis:     Text Interpretation:        Laboratory Studies: Results for orders placed or performed during the hospital encounter of 11/11/20 (from the past 24 hour(s))  CBC with Differential/Platelet      Status: Abnormal   Collection Time: 11/12/20  1:44 AM  Result Value Ref Range   WBC 18.0 (H) 4.0 - 10.5 K/uL   RBC 4.73 3.87 - 5.11 MIL/uL   Hemoglobin 11.1 (L) 12.0 - 15.0 g/dL   HCT 92.3 30.0 - 76.2 %   MCV 76.5 (L) 80.0 - 100.0 fL   MCH 23.5 (L) 26.0 - 34.0 pg   MCHC 30.7 30.0 - 36.0 g/dL   RDW 26.3 (H) 33.5 - 45.6 %   Platelets 332 150 - 400 K/uL   nRBC 0.0 0.0 - 0.2 %   Neutrophils Relative % 55 %   Neutro Abs 9.7 (H) 1.7 - 7.7 K/uL   Lymphocytes Relative 37 %   Lymphs Abs 6.7 (H) 0.7 - 4.0 K/uL   Monocytes Relative 6 %   Monocytes Absolute 1.0 0.1 - 1.0 K/uL   Eosinophils Relative 2 %   Eosinophils Absolute 0.4 0.0 - 0.5 K/uL   Basophils Relative 0 %   Basophils Absolute 0.1 0.0 - 0.1 K/uL   WBC Morphology Abnormal lymphocytes present    RBC Morphology MORPHOLOGY UNREMARKABLE    Smear Review Normal platelet morphology    Immature Granulocytes 0 %   Abs Immature Granulocytes 0.05 0.00 - 0.07 K/uL  Comprehensive metabolic panel     Status: Abnormal   Collection Time: 11/12/20  1:44 AM  Result Value Ref Range   Sodium 137 135 - 145 mmol/L   Potassium 3.8 3.5 - 5.1 mmol/L   Chloride 102 98 - 111 mmol/L   CO2 27 22 - 32 mmol/L   Glucose, Bld 109 (H) 70 - 99 mg/dL   BUN 11 6 - 20 mg/dL   Creatinine, Ser 2.56 0.44 - 1.00 mg/dL   Calcium 8.7 (L) 8.9 - 10.3 mg/dL   Total Protein 6.7 6.5 - 8.1 g/dL   Albumin 3.3 (L) 3.5 - 5.0 g/dL   AST 18 15 - 41 U/L   ALT 17 0 - 44 U/L   Alkaline Phosphatase 84 38 - 126 U/L   Total Bilirubin <0.1 (L) 0.3 - 1.2 mg/dL   GFR, Estimated >38 >93 mL/min   Anion gap 8 5 - 15  Lipase, blood     Status: None   Collection Time: 11/12/20  1:44 AM  Result Value Ref Range   Lipase 44 11 - 51 U/L  Urinalysis, Routine w reflex microscopic     Status: None   Collection Time: 11/12/20  2:09 AM  Result Value Ref Range   Color, Urine YELLOW YELLOW   APPearance CLEAR CLEAR   Specific Gravity, Urine 1.025 1.005 - 1.030   pH 5.5 5.0 - 8.0   Glucose,  UA NEGATIVE NEGATIVE mg/dL   Hgb urine dipstick NEGATIVE NEGATIVE   Bilirubin Urine NEGATIVE NEGATIVE   Ketones, ur NEGATIVE NEGATIVE mg/dL   Protein, ur NEGATIVE NEGATIVE mg/dL  Nitrite NEGATIVE NEGATIVE   Leukocytes,Ua NEGATIVE NEGATIVE  Pregnancy, urine     Status: None   Collection Time: 11/12/20  2:09 AM  Result Value Ref Range   Preg Test, Ur NEGATIVE NEGATIVE   Imaging Studies: CT ABDOMEN PELVIS W CONTRAST  Result Date: 11/12/2020 CLINICAL DATA:  40 year old female with 3 days of lower abdominal pain. Nausea. Left lower quadrant pain. EXAM: CT ABDOMEN AND PELVIS WITH CONTRAST TECHNIQUE: Multidetector CT imaging of the abdomen and pelvis was performed using the standard protocol following bolus administration of intravenous contrast. CONTRAST:  OMNIPAQUE IOHEXOL 300 MG/ML  SOLN COMPARISON:  CT Abdomen and Pelvis 04/22/2019. FINDINGS: Lower chest: Negative. Hepatobiliary: Negative liver and gallbladder. Pancreas: Negative. Spleen: Negative. Adrenals/Urinary Tract: Normal adrenal glands. Mildly lobulated appearance of both kidneys is stable, congenital normal variant. Symmetric renal enhancement. No hydronephrosis or perinephric stranding. No definite nephrolithiasis. No hydroureter. Diminutive and negative urinary bladder. Occasional pelvic phleboliths are stable. Stomach/Bowel: Negative rectum. Mild to moderate sigmoid diverticulosis, although no active inflammation identified. The descending colon is decompressed and normal aside from occasional diverticula. Occasional diverticula in the transverse colon. Negative right colon, terminal ileum. Elongated and normal appendix identified on coronal images 34 and 38. Negative terminal ileum. No dilated small bowel. Negative stomach and duodenum. No free air, free fluid, mesenteric stranding. Vascular/Lymphatic: Suboptimal intravascular contrast bolus but the major arterial structures appear to be patent. Portal venous system grossly patent. No  lymphadenopathy. Reproductive: Within normal limits. Other: No pelvic free fluid. Stable nonspecific subcutaneous stranding or edema in the abdominal panniculus (series 2, image 46). No abdominal hernia. Musculoskeletal: Age advanced lumbar facet degeneration, lumbosacral junction disc degeneration with vacuum phenomena. Similar advanced lower thoracic facet degeneration. Degenerative vacuum SI joint phenomena also. No acute osseous abnormality identified. IMPRESSION: 1. No acute or inflammatory process identified in the abdomen or pelvis. 2. Diverticulosis of the large bowel (primarily sigmoid colon) without active inflammation. 3. 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 Electronically Signed   By: Odessa Fleming M.D.   On: 11/12/2020 04:43   US PELVIC COMPLETE W TRANSVAGINAL AND TORSION R/O  Result Date: 11/12/2020 CLINICAL DATA:  Pelvic pain EXAM: TRANSABDOMINAL AND TRANSVAGINAL ULTRASOUND OF PELVIS DOPPLER ULTRASOUND OF OVARIES TECHNIQUE: Both transabdominal and transvaginal ultrasound examinations of the pelvis were performed. Transabdominal technique was performed for global imaging of the pelvis including uterus, ovaries, adnexal regions, and pelvic cul-de-sac. It was necessary to proceed with endovaginal exam following the transabdominal exam to visualize the ovaries. Color and duplex Doppler ultrasound was utilized to evaluate blood flow to the ovaries. COMPARISON:  CT dated April 22, 2019 FINDINGS: Uterus Measurements: 11.5 x 5.2 x 5 cm = volume: 155 mL. No fibroids or other mass visualized. Endometrium Thickness: 12 mm.  No focal abnormality visualized. Right ovary Measurements: 3.3 x 1.6 x 2.1 cm = volume: 6 mL. There is a 1.9 x 1.9 x 1.6 cm cystic structure favored to represent a dominant follicle or small hemorrhagic cyst. Left ovary Not visualized Pulsed Doppler evaluation of the right ovary demonstrates normal venous arterial waveforms. Other findings Exam was limited by patient body habitus. IMPRESSION: 1.  No acute abnormality. 2. The left ovary was not visualized. Electronically Signed   By: Katherine Mantle M.D.   On: 11/12/2020 03:40    ED COURSE and MDM  Nursing notes, initial and subsequent vitals signs, including pulse oximetry, reviewed and interpreted by myself.  Vitals:   11/11/20 2311 11/12/20 0223 11/12/20 0355 11/12/20 0505  BP:  121/72  130/70  Pulse:  99  99  Resp:  20  18  Temp:      TempSrc:      SpO2:  96%  94%  Weight: (!) 199.4 kg  (!) 200.3 kg   Height: 5\' 10"  (1.778 m)      Medications  ondansetron (ZOFRAN) injection 4 mg (has no administration in time range)  iohexol (OMNIPAQUE) 300 MG/ML solution 100 mL (100 mLs Intravenous Contrast Given 11/12/20 0427)   Patient advised of laboratory and CT findings.  The only possible cause of the pain seen was a likely ovarian cyst but there was no lesion suspicious for malignancy or infection.   PROCEDURES  Procedures   ED DIAGNOSES     ICD-10-CM   1. Left lower quadrant abdominal pain  R10.32            3. Cyst of right ovary  N83.201        Damarko Stitely, 11/14/20, MD 11/12/20 [phone]

## 2020-11-12 ENCOUNTER — Emergency Department (HOSPITAL_BASED_OUTPATIENT_CLINIC_OR_DEPARTMENT_OTHER): Payer: Medicaid Other

## 2020-11-12 ENCOUNTER — Encounter (HOSPITAL_BASED_OUTPATIENT_CLINIC_OR_DEPARTMENT_OTHER): Payer: Self-pay

## 2020-11-12 LAB — CBC WITH DIFFERENTIAL/PLATELET
Abs Immature Granulocytes: 0.05 10*3/uL (ref 0.00–0.07)
Basophils Absolute: 0.1 10*3/uL (ref 0.0–0.1)
Basophils Relative: 0 %
Eosinophils Absolute: 0.4 10*3/uL (ref 0.0–0.5)
Eosinophils Relative: 2 %
HCT: 36.2 % (ref 36.0–46.0)
Hemoglobin: 11.1 g/dL — ABNORMAL LOW (ref 12.0–15.0)
Immature Granulocytes: 0 %
Lymphocytes Relative: 37 %
Lymphs Abs: 6.7 10*3/uL — ABNORMAL HIGH (ref 0.7–4.0)
MCH: 23.5 pg — ABNORMAL LOW (ref 26.0–34.0)
MCHC: 30.7 g/dL (ref 30.0–36.0)
MCV: 76.5 fL — ABNORMAL LOW (ref 80.0–100.0)
Monocytes Absolute: 1 10*3/uL (ref 0.1–1.0)
Monocytes Relative: 6 %
Neutro Abs: 9.7 10*3/uL — ABNORMAL HIGH (ref 1.7–7.7)
Neutrophils Relative %: 55 %
Platelets: 332 10*3/uL (ref 150–400)
RBC: 4.73 MIL/uL (ref 3.87–5.11)
RDW: 17.7 % — ABNORMAL HIGH (ref 11.5–15.5)
Smear Review: NORMAL
WBC Morphology: ABNORMAL
WBC: 18 10*3/uL — ABNORMAL HIGH (ref 4.0–10.5)
nRBC: 0 % (ref 0.0–0.2)

## 2020-11-12 LAB — COMPREHENSIVE METABOLIC PANEL
ALT: 17 U/L (ref 0–44)
AST: 18 U/L (ref 15–41)
Albumin: 3.3 g/dL — ABNORMAL LOW (ref 3.5–5.0)
Alkaline Phosphatase: 84 U/L (ref 38–126)
Anion gap: 8 (ref 5–15)
BUN: 11 mg/dL (ref 6–20)
CO2: 27 mmol/L (ref 22–32)
Calcium: 8.7 mg/dL — ABNORMAL LOW (ref 8.9–10.3)
Chloride: 102 mmol/L (ref 98–111)
Creatinine, Ser: 0.73 mg/dL (ref 0.44–1.00)
GFR, Estimated: >60 mL/min (ref >60)
Glucose, Bld: 109 mg/dL — ABNORMAL HIGH (ref 70–99)
Potassium: 3.8 mmol/L (ref 3.5–5.1)
Sodium: 137 mmol/L (ref 135–145)
Total Bilirubin: <0.1 mg/dL — ABNORMAL LOW (ref 0.3–1.2)
Total Protein: 6.7 g/dL (ref 6.5–8.1)

## 2020-11-12 LAB — URINALYSIS, ROUTINE W REFLEX MICROSCOPIC
Bilirubin Urine: NEGATIVE
Glucose, UA: NEGATIVE mg/dL
Hgb urine dipstick: NEGATIVE
Ketones, ur: NEGATIVE mg/dL
Leukocytes,Ua: NEGATIVE
Nitrite: NEGATIVE
Protein, ur: NEGATIVE mg/dL
Specific Gravity, Urine: 1.025 (ref 1.005–1.030)
pH: 5.5 (ref 5.0–8.0)

## 2020-11-12 LAB — LIPASE, BLOOD: Lipase: 44 U/L (ref 11–51)

## 2020-11-12 LAB — PREGNANCY, URINE: Preg Test, Ur: NEGATIVE

## 2020-11-12 MED ORDER — IOHEXOL 300 MG/ML  SOLN
100.0000 mL | Freq: Once | INTRAMUSCULAR | Status: AC | PRN
  Administered 2020-11-12: 100 mL via INTRAVENOUS

## 2020-11-12 NOTE — ED Notes (Signed)
PIV attempts right AC and left AC unsuccessful. Pt given cup to obtain urine sample

## 2020-11-12 NOTE — ED Notes (Signed)
Attempted ultrasound IV X 2 without success. Patient tolerated well. 

## 2021-06-17 ENCOUNTER — Encounter (HOSPITAL_BASED_OUTPATIENT_CLINIC_OR_DEPARTMENT_OTHER): Payer: Self-pay | Admitting: Emergency Medicine

## 2021-06-17 ENCOUNTER — Emergency Department (HOSPITAL_BASED_OUTPATIENT_CLINIC_OR_DEPARTMENT_OTHER)
Admission: EM | Admit: 2021-06-17 | Discharge: 2021-06-17 | Disposition: A | Discharge status: Home / Self Care | Payer: Medicaid Other | Attending: Emergency Medicine | Admitting: Emergency Medicine

## 2021-06-17 ENCOUNTER — Other Ambulatory Visit: Payer: Self-pay

## 2021-06-17 DIAGNOSIS — J45909 Unspecified asthma, uncomplicated: Secondary | ICD-10-CM | POA: Insufficient documentation

## 2021-06-17 DIAGNOSIS — Z79899 Other long term (current) drug therapy: Secondary | ICD-10-CM | POA: Diagnosis not present

## 2021-06-17 DIAGNOSIS — Z7951 Long term (current) use of inhaled steroids: Secondary | ICD-10-CM | POA: Insufficient documentation

## 2021-06-17 DIAGNOSIS — F17210 Nicotine dependence, cigarettes, uncomplicated: Secondary | ICD-10-CM | POA: Diagnosis not present

## 2021-06-17 DIAGNOSIS — I10 Essential (primary) hypertension: Secondary | ICD-10-CM | POA: Diagnosis not present

## 2021-06-17 DIAGNOSIS — K0889 Other specified disorders of teeth and supporting structures: Secondary | ICD-10-CM | POA: Diagnosis present

## 2021-06-17 DIAGNOSIS — R220 Localized swelling, mass and lump, head: Secondary | ICD-10-CM

## 2021-06-17 MED ORDER — KETOROLAC TROMETHAMINE 30 MG/ML IJ SOLN
30.0000 mg | Freq: Once | INTRAMUSCULAR | Status: AC
  Administered 2021-06-17: 30 mg via INTRAMUSCULAR
  Filled 2021-06-17: qty 1

## 2021-06-17 MED ORDER — OXYCODONE-ACETAMINOPHEN 5-325 MG PO TABS: 1.0000 | ORAL_TABLET | Freq: Four times a day (QID) | ORAL | 0 refills | Status: DC | PRN

## 2021-06-17 NOTE — ED Triage Notes (Signed)
Reports dental pain to right upper on and off for the last few months.  Seen by dentist.  Was told it was an issue with wisdom teeth.  Unable to get appointment until November.

## 2021-06-17 NOTE — ED Provider Notes (Signed)
[facility] Provider Note: Lowella Dell, MD, FACEP  CSN: [id number] MRN: [id number] ARRIVAL: 06/17/21 at 0409 ROOM: MH07/MH07   CHIEF COMPLAINT  Dental Pain   HISTORY OF PRESENT ILLNESS  06/17/21 5:20 AM Morgan Bryant is a 40 y.o. female who has had several months of pain associated with her right upper third molar.  She has been seen by a dentist who told her she needs to see an oral surgeon but cannot get an appointment until November.  Her pain acutely worsened over the past 3 days.  She is having 10 out of 10 pain in that tooth radiating to the right side of her head.  She describes it as aching, sharp and stabbing.  It is somewhat worse with eating.  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  She was told by her dentist that this is due to chronic irritation from her bite.  She has not gotten adequate relief with over-the-counter medications.   Past Medical History:  Diagnosis Date   Anemia    Asthma    Hypertension    Morbid obesity (HCC)    Sleep apnea     Past Surgical History:  Procedure Laterality Date   CESAREAN SECTION      Family History  Problem Relation Age of Onset   Hypertension Mother    Cancer Father     Social History   Tobacco Use   Smoking status: Every Day    Packs/day: 0.50    Types: Cigarettes   Smokeless tobacco: Never  Vaping Use   Vaping Use: Former  Substance Use Topics   Alcohol use: Yes    Comment: occ   Drug use: No    Prior to Admission medications   Medication Sig Start Date End Date Taking? Authorizing Provider  albuterol (PROVENTIL HFA;VENTOLIN HFA) 108 (90 Base) MCG/ACT inhaler Inhale 1-2 puffs into the lungs every 6 (six) hours as needed for wheezing or shortness of breath. 11/03/18   Maxwell Caul, PA-C  amLODipine (NORVASC) 5 MG tablet Take 5 mg by mouth daily.    [provider]  ferrous sulfate 325 (65 FE) MG tablet Take 1 tablet (325 mg total) by mouth 2 (two)  times daily with a meal. Patient not taking: Reported on 08/07/2018 06/24/17 04/22/19  Osvaldo Shipper, MD  fluticasone (FLOVENT DISKUS) 50 MCG/BLIST diskus inhaler Inhale 2 puffs into the lungs 2 (two) times daily. Patient not taking: Reported on 08/07/2018 06/24/17 04/22/19  Osvaldo Shipper, MD    Allergies Penicillins   REVIEW OF SYSTEMS  Negative except as noted here or in the History of Present Illness.   PHYSICAL EXAMINATION  Initial Vital Signs Blood pressure (!) 155/100, pulse 91, temperature 98.6 F (37 C), temperature source Oral, resp. rate 18, height 5\' 10"  (1.778 m), weight (!) 200.3 kg, last menstrual period 06/06/2021, SpO2 97 %.  Examination General: Well-developed, well-nourished female in no acute distress; appearance consistent with age of record HENT: normocephalic; atraumatic; right upper third molar with tenderness to percussion but no adjacent gum erythema or swelling; small irregular papule of buccal surface of right cheek Eyes: pupils equal, round and reactive to light; extraocular muscles intact Neck: supple Heart: regular rate and rhythm Lungs: clear to auscultation bilaterally Abdomen: soft; nondistended; nontender; bowel sounds present Extremities: No deformity; full range of motion Neurologic: Awake, alert and oriented; motor function intact in all extremities and symmetric; no facial droop Skin: Warm and dry Psychiatric:  Normal mood and affect   RESULTS  Summary of this visit's results, reviewed and interpreted by myself:   EKG Interpretation  Date/Time:    Ventricular Rate:    PR Interval:    QRS Duration:   QT Interval:    QTC Calculation:   R Axis:     Text Interpretation:         Laboratory Studies: No results found for this or any previous visit (from the past 24 hour(s)). Imaging Studies: No results found.  ED COURSE and MDM  Nursing notes, initial and subsequent vitals signs, including pulse oximetry, reviewed and interpreted  by myself.  Vitals:   06/17/21 0432 06/17/21 0433 06/17/21 0452  BP: (!) 155/100    Pulse: 91    Resp: 18    Temp: 98.6 F (37 C)    TempSrc: Oral    SpO2: 97%  97%  Weight:  (!) 200.3 kg   Height:  5\' 10"  (1.778 m)    Medications  ketorolac (TORADOL) 30 MG/ML injection 30 mg (has no administration in time range)    We will place the patient on a brief course of analgesia and refer to the oral surgeon on-call.  She was advised that the care she needs is likely extraction of her wisdom teeth.  The oral surgeon can also deal with the papule on her cheek which will need to be biopsied.  I have a low suspicion of infectious etiology at this time due to the pain's chronicity.  PROCEDURES  Procedures   ED DIAGNOSES     ICD-10-CM   1. Pain, dental  K08.89     2. Buccal mass  R22.0          Traycen Goyer, , MD 06/17/21 [phone]

## 2023-09-13 ENCOUNTER — Emergency Department (HOSPITAL_BASED_OUTPATIENT_CLINIC_OR_DEPARTMENT_OTHER): Payer: MEDICAID

## 2023-09-13 ENCOUNTER — Emergency Department (HOSPITAL_BASED_OUTPATIENT_CLINIC_OR_DEPARTMENT_OTHER)
Admission: EM | Admit: 2023-09-13 | Discharge: 2023-09-14 | Disposition: A | Discharge status: Home / Self Care | Payer: MEDICAID | Attending: Emergency Medicine | Admitting: Emergency Medicine

## 2023-09-13 ENCOUNTER — Other Ambulatory Visit: Payer: Self-pay

## 2023-09-13 ENCOUNTER — Encounter (HOSPITAL_BASED_OUTPATIENT_CLINIC_OR_DEPARTMENT_OTHER): Payer: Self-pay | Admitting: Emergency Medicine

## 2023-09-13 DIAGNOSIS — Z1152 Encounter for screening for COVID-19: Secondary | ICD-10-CM | POA: Insufficient documentation

## 2023-09-13 DIAGNOSIS — F17210 Nicotine dependence, cigarettes, uncomplicated: Secondary | ICD-10-CM | POA: Diagnosis not present

## 2023-09-13 DIAGNOSIS — B349 Viral infection, unspecified: Secondary | ICD-10-CM | POA: Diagnosis not present

## 2023-09-13 DIAGNOSIS — R0602 Shortness of breath: Secondary | ICD-10-CM | POA: Diagnosis present

## 2023-09-13 DIAGNOSIS — I10 Essential (primary) hypertension: Secondary | ICD-10-CM | POA: Insufficient documentation

## 2023-09-13 DIAGNOSIS — Z79899 Other long term (current) drug therapy: Secondary | ICD-10-CM | POA: Diagnosis not present

## 2023-09-13 DIAGNOSIS — J45901 Unspecified asthma with (acute) exacerbation: Secondary | ICD-10-CM | POA: Diagnosis not present

## 2023-09-13 MED ORDER — ALBUTEROL SULFATE (2.5 MG/3ML) 0.083% IN NEBU
2.5000 mg | INHALATION_SOLUTION | Freq: Once | RESPIRATORY_TRACT | Status: AC
  Administered 2023-09-13: 2.5 mg via RESPIRATORY_TRACT
  Filled 2023-09-13: qty 3

## 2023-09-13 MED ORDER — PREDNISONE 20 MG PO TABS: 40.0000 mg | ORAL_TABLET | Freq: Every day | ORAL | 0 refills | Status: AC

## 2023-09-13 MED ORDER — PREDNISONE 50 MG PO TABS
60.0000 mg | ORAL_TABLET | Freq: Once | ORAL | Status: AC
  Administered 2023-09-13: 60 mg via ORAL
  Filled 2023-09-13: qty 1

## 2023-09-13 MED ORDER — IPRATROPIUM-ALBUTEROL 0.5-2.5 (3) MG/3ML IN SOLN
3.0000 mL | Freq: Once | RESPIRATORY_TRACT | Status: AC
  Administered 2023-09-13: 3 mL via RESPIRATORY_TRACT
  Filled 2023-09-13: qty 3

## 2023-09-13 NOTE — ED Triage Notes (Signed)
Pt states asthma exacerbation x "a few days" and has been trying to use her inhaler. She reports being out of her nebulizer solution. She does endorse sore throat, cough and runny nose since Tuesday. Pt is able to answer questions without difficulty, states this is the time of year she usually has flare ups. RRT at bedside during triage. Per RRT pt has expiratory wheeze throughout.

## 2023-09-13 NOTE — ED Provider Notes (Signed)
[facility]-EMERGENCY DEPT [facility] Emergency Department Provider Note MRN:  [id number]  Arrival date & time: 09/14/23     Chief Complaint   Shortness of Breath   History of Present Illness   Morgan Bryant is a 42 y.o. year-old female with a history of asthma, hypertension presenting to the ED with chief complaint of shortness of breath.  Increased shortness of breath with the past few days, feels like her asthma was flaring up.  Has had scratchy throat, cough, snotty nose during this time.  Ran out of inhaler.  Review of Systems  A thorough review of systems was obtained and all systems are negative except as noted in the HPI and PMH.   Patient's Health History    Past Medical History:  Diagnosis Date   Anemia    Asthma    Hypertension    Morbid obesity (HCC)    Sleep apnea     Past Surgical History:  Procedure Laterality Date   CESAREAN SECTION      Family History  Problem Relation Age of Onset   Hypertension Mother    Cancer Father     Social History   Socioeconomic History   Marital status: Single    Spouse name: Not on file   Number of children: Not on file   Years of education: Not on file   Highest education level: Not on file  Occupational History   Not on file  Tobacco Use   Smoking status: Every Day    Current packs/day: 0.50    Types: Cigarettes   Smokeless tobacco: Never  Vaping Use   Vaping status: Former  Substance and Sexual Activity   Alcohol use: Yes    Comment: occ   Drug use: No   Sexual activity: Not on file  Other Topics Concern   Not on file  Social History Narrative   Not on file   Social Determinants of Health   Financial Resource Strain: Not on file  Food Insecurity: No Food Insecurity (01/15/2021)   Received from [facility]   Hunger Vital Sign    Worried About Running Out of Food in the Last Year: Never true    Ran Out of Food in the Last Year: Never true  Transportation Needs: Not on file  Physical Activity:  Not on file  Stress: Not on file  Social Connections: Unknown (03/05/2022)   Received from [facility]   Social Network    Social Network: Not on file  Intimate Partner Violence: Unknown (01/29/2022)   Received from [facility]   HITS    Physically Hurt: Not on file    Insult or Talk Down To: Not on file    Threaten Physical Harm: Not on file    Scream or Curse: Not on file     Physical Exam   Vitals:   09/13/23 2338 09/13/23 2344  BP: (!) 154/107   Pulse: 100   Resp: (!) 24   Temp: 98.2 F (36.8 C)   SpO2: 97% 95%    CONSTITUTIONAL: Well-appearing, NAD NEURO/PSYCH:  Alert and oriented x 3, no focal deficits EYES:  eyes equal and reactive ENT/NECK:  no LAD, no JVD CARDIO: Regular rate, well-perfused, normal S1 and S2 PULM: Diffuse wheezing, no increased work of breathing GI/GU:  non-distended, non-tender MSK/SPINE:  No gross deformities, no edema SKIN:  no rash, atraumatic   *Additional and/or pertinent findings included in MDM below  Diagnostic and Interventional Summary    EKG Interpretation Date/Time:  Saturday September 13 2023 23:50:53 EST Ventricular Rate:  91 PR Interval:  172 QRS Duration:  82 QT Interval:  363 QTC Calculation: 447 R Axis:   60  Text Interpretation: Sinus rhythm Confirmed by Kennis Carina [phone]) on 09/13/2023 11:51:39 PM       Labs Reviewed  RESP PANEL BY RT-PCR (RSV, FLU A&B, COVID)  [id number]    DG Chest Port 1 View  Final Result      Medications  ipratropium-albuterol (DUONEB) 0.5-2.5 (3) MG/3ML nebulizer solution 3 mL (3 mLs Nebulization Given 09/13/23 2345)  albuterol (PROVENTIL) (2.5 MG/3ML) 0.083% nebulizer solution 2.5 mg (2.5 mg Nebulization Given 09/13/23 2345)  predniSONE (DELTASONE) tablet 60 mg (60 mg Oral Given 09/13/23 2350)     Procedures  /  Critical Care Procedures  ED Course and Medical Decision Making  Initial Impression and Ddx Suspect viral illness triggering asthma exacerbation.  Considering COVID  flu.  The oropharynx looks relatively normal, doubt strep throat.  Past medical/surgical history that increases complexity of ED encounter: Asthma  Interpretation of Diagnostics I personally reviewed the Chest Xray and my interpretation is as follows: No pneumothorax or lobar opacity  EKG without concerning features or changes  Patient Reassessment and Ultimate Disposition/Management     Seems to be doing better after breathing treatment, will improve fighting with steroid burst, appropriate for discharge.  Patient management required discussion with the following services or consulting groups:  None  Complexity of Problems Addressed Acute illness or injury that poses threat of life of bodily function  Additional Data Reviewed and Analyzed Further history obtained from: Prior ED visit notes and Prior labs/imaging results  Additional Factors Impacting ED Encounter Risk Prescriptions  Elmer Sow. Pilar Plate, MD [facility] [email or web address]  Final Clinical Impressions(s) / ED Diagnoses     ICD-10-CM   1. Exacerbation of asthma, unspecified asthma severity, unspecified whether persistent  J45.901     2. Viral illness  B34.9       ED Discharge Orders          Ordered    albuterol (VENTOLIN HFA) 108 (90 Base) MCG/ACT inhaler  Every 4 hours PRN        09/14/23 0034    predniSONE (DELTASONE) 20 MG tablet  Daily        09/13/23 2345             Discharge Instructions Discussed with and Provided to Patient:     Discharge Instructions      You were evaluated in the Emergency Department and after careful evaluation, we did not find any emergent condition requiring admission or further testing in the hospital.  Your exam/testing today is overall reassuring.  Symptoms likely due to a flare of your asthma, triggered by a viral illness.  Take the prednisone medication as directed starting morning of November 18.  Use the  albuterol inhaler as needed every 4 hours, follow-up with primary care doctor.  Please return to the Emergency Department if you experience any worsening of your condition.   Thank you for allowing [place] to be a part of your care.       Sabas Sous, MD 09/14/23 [phone]

## 2023-09-14 ENCOUNTER — Emergency Department (HOSPITAL_BASED_OUTPATIENT_CLINIC_OR_DEPARTMENT_OTHER): Payer: MEDICAID

## 2023-09-14 LAB — RESP PANEL BY RT-PCR (RSV, FLU A&B, COVID)  RVPGX2
Influenza A by PCR: NEGATIVE
Influenza B by PCR: NEGATIVE
Resp Syncytial Virus by PCR: NEGATIVE
SARS Coronavirus 2 by RT PCR: NEGATIVE

## 2023-09-14 MED ORDER — ALBUTEROL SULFATE HFA 108 (90 BASE) MCG/ACT IN AERS: 2.0000 | INHALATION_SPRAY | RESPIRATORY_TRACT | 1 refills | Status: AC | PRN

## 2023-09-14 NOTE — Discharge Instructions (Signed)
You were evaluated in the Emergency Department and after careful evaluation, we did not find any emergent condition requiring admission or further testing in the hospital.  Your exam/testing today is overall reassuring.  Symptoms likely due to a flare of your asthma, triggered by a viral illness.  Take the prednisone medication as directed starting morning of November 18.  Use the albuterol inhaler as needed every 4 hours, follow-up with primary care doctor.  Please return to the Emergency Department if you experience any worsening of your condition.   Thank you for allowing [place] to be a part of your care.

## 2023-11-16 NOTE — ED Notes (Signed)
 Attempted report on patient.    Richerd Balboa, RN 11/16/23 [phone]

## 2023-11-16 NOTE — ED Provider Notes (Signed)
 " [facility] Emergency Department Provider Note  MRN: [id number] Chief Complaint:  Chief Complaint  Patient presents with   Shortness of Breath    History and Review of Systems  Morgan Bryant is a 43 y.o. year-old female with a medical history discussed below presenting to the ED with chief complaint as above.   This is a 43 year old female with past medical history of hypertension, asthma, and obesity who presents with shortness of breath.  Patient reports history of asthma and has been wheezing for the last couple days.  Has been using her albuterol  without relief.  Associated with some nonproductive cough and chest pain with coughing.  No persistent chest pain, leg swelling, fever, chills, vomiting, diarrhea.  Review of Systems A pertinent review of systems was obtained and was negative except as noted in the HPI and MDM. Past Surgical History:  Procedure Laterality Date   CESAREAN SECTION, UNSPECIFIED     Procedure: CESAREAN SECTION   COLPOSCOPY     Procedure: COLPOSCOPY   DILATION AND CURETTAGE OF UTERUS     Procedure: DILATION AND CURETTAGE OF UTERUS   INDUCED ABORTION     Procedure: INDUCED ABORTION   TONSILLECTOMY AND ADENOIDECTOMY     Procedure: TONSILLECTOMY AND ADENOIDECTOMY   TUBAL LIGATION     Procedure: TUBAL LIGATION    Past Medical History:  Diagnosis Date   Abnormal Pap smear of cervix    Anemia    Asthma    Chlamydia    Diverticulitis    Hypertension    Mesenteric panniculitis (CMD)    Obesity    OSA (obstructive sleep apnea)    Urogenital trichomoniasis    Vitamin D deficiency     Physical Exam  Physical Exam Vitals and nursing note reviewed.  Constitutional:      Appearance: She is not ill-appearing.  HENT:     Head: Normocephalic and atraumatic.     Mouth/Throat:     Mouth: Mucous membranes are moist.     Pharynx: Oropharynx is clear.  Eyes:     Extraocular Movements: Extraocular movements intact.  Cardiovascular:      Rate and Rhythm: Normal rate.     Heart sounds: Normal heart sounds.  Pulmonary:     Effort: Pulmonary effort is normal.     Breath sounds: Decreased breath sounds, wheezing and rhonchi present.  Abdominal:     General: Abdomen is flat. There is no distension.     Palpations: Abdomen is soft.     Tenderness: There is no abdominal tenderness.  Musculoskeletal:        General: Normal range of motion.     Cervical back: Normal range of motion.  Skin:    General: Skin is warm and dry.     Capillary Refill: Capillary refill takes less than 2 seconds.     Findings: No rash.  Neurological:     General: No focal deficit present.     Mental Status: She is alert. Mental status is at baseline.  Psychiatric:        Mood and Affect: Mood normal.        Behavior: Behavior normal.       Procedures   Procedures  ED MEDS: Medications  albuterol  sulfate 2.5 mg/0.5 mL nebulizer solution 2.5 mg (2.5 mg nebulization Not Given 11/16/23 1600)  ipratropium-albuteroL  (DUO-NEB) 0.5-2.5 mg/3 mL nebulizer solution 3 mL (has no administration in time range)  albuterol  sulfate 2.5 mg/0.5 mL nebulizer solution 2.5 mg (has no administration  in time range)  predniSONE  (DELTASONE ) tablet 60 mg (has no administration in time range)  ipratropium-albuteroL  (DUO-NEB) 0.5-2.5 mg/3 mL nebulizer solution 3 mL (3 mL nebulization Given 11/16/23 1738)  magnesium  sulfate 2g in sterile water 50 mL IVPB (0 g intravenous Stopped 11/16/23 1741)      Medical Decision Making  Patient is a 43 y.o. female presenting with 2 days of symptoms as above. Patient has  history of asthma/wheezing with respiratory illness in the past. No significant family history of atopic disease. On arrival, patient is afebrile, hemodynamically stable. SpO2 > 92% on RA. PE as above, notable for wheezing, tachypnea, and intermittent rhonchi.  Ddx includes but not limited un:RNEI or asthma, CHF, other volume overload, pneumonia, pneumothorax,  anemia, ACS, PE. Also considered viral illness, bronchitis. Low suspicion for PE given no clinical signs of DVT, no tachycardia, no pleuritic CP.   Presentation most consistent with asthma exacerbation. Do not feel that CXR is necessary at this time, have low suspicion for superimposed bacterial infection given non-focal exam with diffuse wheezing and clear history of asthma as above.   Intervention: Given Solu-Medrol  by EMS. Given magnesium  here. 3 duonebs.   Interpretation of Diagnostics: I personally reviewed the patient's ekg/lab tests/imaging and my interpretation is as follows:  Cbc without significant leukocytosis or anemia. Chemistry without AKI, anion gap, signs of dehydration, or other significant electrolyte or metabolic derrangements.  CXR without pneumonia, pneumothorax, effusion, or significant pulmonary edema. Notes pulmonary vascular congestion but I have a low suspicion for heart failure given patients very good response to treatments and hx of asthma with similar presentations Iin the past.  BNP mildly elevated at 133, no priors.   On reassessment, patient's work of breathing improved and lung sounds improved. Will plan for duonebs every 4 hours and steroids. Will send to CDU for ongoing treatment and monitoring  handoff given.    Past medical/surgical history that increases complexity of ED encounter:  asthma  Further history obtained from: Prior ED/Urgent Care Note  as stated in MDM/HPI. Factors Impacting ED Encounter Risk: Discussion regarding hospitalization Patient's presentation is most consistent with acute presentation with potential threat to life or bodily function.  Final Clinical Impressions(s) 1. Moderate persistent asthma with acute exacerbation     ED Disposition:  Data Unavailable   ED Prescriptions   None     "

## 2023-11-16 NOTE — ED Triage Notes (Signed)
 Pt coming in by EMS from home. Pt reports asthma flare up and SOB since last night. Pt denies other symptoms. Pt has an inhaler at home, neb machine is not working.

## 2023-11-19 NOTE — ED Notes (Signed)
 Morgan Bryant left a voice message on the Resource Nurse line stating the pharmacy doesn't have the medication she was rx'd.  Called pt back at [phone] as directed, pt NA. LVMTRC         Alan Louder [place], RN 11/19/23 1028

## 2024-11-30 ENCOUNTER — Other Ambulatory Visit: Payer: Self-pay

## 2024-11-30 ENCOUNTER — Observation Stay (HOSPITAL_BASED_OUTPATIENT_CLINIC_OR_DEPARTMENT_OTHER): Admission: EM | Admit: 2024-11-30 | Payer: MEDICAID | Source: Home / Self Care

## 2024-11-30 ENCOUNTER — Emergency Department (HOSPITAL_BASED_OUTPATIENT_CLINIC_OR_DEPARTMENT_OTHER): Payer: MEDICAID

## 2024-11-30 ENCOUNTER — Encounter (HOSPITAL_BASED_OUTPATIENT_CLINIC_OR_DEPARTMENT_OTHER): Payer: Self-pay | Admitting: Emergency Medicine

## 2024-11-30 DIAGNOSIS — G4733 Obstructive sleep apnea (adult) (pediatric): Secondary | ICD-10-CM | POA: Diagnosis present

## 2024-11-30 DIAGNOSIS — R0602 Shortness of breath: Secondary | ICD-10-CM

## 2024-11-30 DIAGNOSIS — J111 Influenza due to unidentified influenza virus with other respiratory manifestations: Principal | ICD-10-CM

## 2024-11-30 DIAGNOSIS — J101 Influenza due to other identified influenza virus with other respiratory manifestations: Secondary | ICD-10-CM | POA: Diagnosis present

## 2024-11-30 DIAGNOSIS — J45901 Unspecified asthma with (acute) exacerbation: Secondary | ICD-10-CM | POA: Diagnosis present

## 2024-11-30 DIAGNOSIS — R6510 Systemic inflammatory response syndrome (SIRS) of non-infectious origin without acute organ dysfunction: Secondary | ICD-10-CM

## 2024-11-30 DIAGNOSIS — R079 Chest pain, unspecified: Secondary | ICD-10-CM

## 2024-11-30 LAB — CBC
HCT: 36.8 % (ref 36.0–46.0)
Hemoglobin: 11.6 g/dL — ABNORMAL LOW (ref 12.0–15.0)
MCH: 23.8 pg — ABNORMAL LOW (ref 26.0–34.0)
MCHC: 31.5 g/dL (ref 30.0–36.0)
MCV: 75.6 fL — ABNORMAL LOW (ref 80.0–100.0)
Platelets: 272 10*3/uL (ref 150–400)
RBC: 4.87 MIL/uL (ref 3.87–5.11)
RDW: 16.7 % — ABNORMAL HIGH (ref 11.5–15.5)
WBC: 8.6 10*3/uL (ref 4.0–10.5)
nRBC: 0 % (ref 0.0–0.2)

## 2024-11-30 LAB — BASIC METABOLIC PANEL WITH GFR
Anion gap: 13 (ref 5–15)
BUN: 9 mg/dL (ref 6–20)
CO2: 21 mmol/L — ABNORMAL LOW (ref 22–32)
Calcium: 8.8 mg/dL — ABNORMAL LOW (ref 8.9–10.3)
Chloride: 103 mmol/L (ref 98–111)
Creatinine, Ser: 0.76 mg/dL (ref 0.44–1.00)
GFR, Estimated: >60 mL/min
Glucose, Bld: 99 mg/dL (ref 70–99)
Potassium: 3.7 mmol/L (ref 3.5–5.1)
Sodium: 138 mmol/L (ref 135–145)

## 2024-11-30 LAB — PREGNANCY, URINE: Preg Test, Ur: NEGATIVE

## 2024-11-30 LAB — RESP PANEL BY RT-PCR (RSV, FLU A&B, COVID)  RVPGX2
Influenza A by PCR: NEGATIVE
Influenza B by PCR: POSITIVE — AB
Resp Syncytial Virus by PCR: NEGATIVE
SARS Coronavirus 2 by RT PCR: NEGATIVE

## 2024-11-30 MED ORDER — OSELTAMIVIR PHOSPHATE 75 MG PO CAPS
75.0000 mg | ORAL_CAPSULE | Freq: Once | ORAL | Status: AC
  Administered 2024-11-30: 75 mg via ORAL
  Filled 2024-11-30: qty 1

## 2024-11-30 MED ORDER — ALBUTEROL SULFATE (2.5 MG/3ML) 0.083% IN NEBU
2.5000 mg | INHALATION_SOLUTION | Freq: Once | RESPIRATORY_TRACT | Status: AC
  Administered 2024-11-30: 2.5 mg via RESPIRATORY_TRACT
  Filled 2024-11-30: qty 3

## 2024-11-30 MED ORDER — IPRATROPIUM-ALBUTEROL 0.5-2.5 (3) MG/3ML IN SOLN
3.0000 mL | Freq: Once | RESPIRATORY_TRACT | Status: AC
  Administered 2024-11-30: 3 mL via RESPIRATORY_TRACT

## 2024-11-30 MED ORDER — IPRATROPIUM-ALBUTEROL 0.5-2.5 (3) MG/3ML IN SOLN
3.0000 mL | Freq: Once | RESPIRATORY_TRACT | Status: AC
  Filled 2024-11-30: qty 3

## 2024-11-30 MED ORDER — IPRATROPIUM-ALBUTEROL 0.5-2.5 (3) MG/3ML IN SOLN
RESPIRATORY_TRACT | Status: AC
  Administered 2024-11-30: 3 mL via RESPIRATORY_TRACT
  Filled 2024-11-30: qty 3

## 2024-11-30 MED ORDER — IPRATROPIUM-ALBUTEROL 0.5-2.5 (3) MG/3ML IN SOLN
3.0000 mL | RESPIRATORY_TRACT | Status: DC | PRN
  Administered 2024-12-01: 3 mL via RESPIRATORY_TRACT
  Filled 2024-11-30: qty 3

## 2024-11-30 NOTE — ED Notes (Signed)
 Patient transported to X-ray

## 2024-12-01 DIAGNOSIS — J101 Influenza due to other identified influenza virus with other respiratory manifestations: Secondary | ICD-10-CM

## 2024-12-01 DIAGNOSIS — R079 Chest pain, unspecified: Secondary | ICD-10-CM

## 2024-12-01 DIAGNOSIS — R6510 Systemic inflammatory response syndrome (SIRS) of non-infectious origin without acute organ dysfunction: Secondary | ICD-10-CM

## 2024-12-01 LAB — TROPONIN T, HIGH SENSITIVITY: Troponin T High Sensitivity: <6 ng/L (ref 0–19)

## 2024-12-01 LAB — D-DIMER, QUANTITATIVE: D-Dimer, Quant: 0.84 ug{FEU}/mL — ABNORMAL HIGH (ref 0.00–0.50)

## 2024-12-01 LAB — HIV ANTIBODY (ROUTINE TESTING W REFLEX): HIV Screen 4th Generation wRfx: NONREACTIVE

## 2024-12-01 MED ORDER — ALBUTEROL SULFATE (2.5 MG/3ML) 0.083% IN NEBU: 2.5000 mg | INHALATION_SOLUTION | RESPIRATORY_TRACT | Status: DC | PRN

## 2024-12-01 MED ORDER — IPRATROPIUM-ALBUTEROL 0.5-2.5 (3) MG/3ML IN SOLN
3.0000 mL | Freq: Four times a day (QID) | RESPIRATORY_TRACT | Status: DC
  Administered 2024-12-01 (×2): 3 mL via RESPIRATORY_TRACT
  Filled 2024-12-01 (×2): qty 3

## 2024-12-01 MED ORDER — IPRATROPIUM-ALBUTEROL 0.5-2.5 (3) MG/3ML IN SOLN
3.0000 mL | Freq: Three times a day (TID) | RESPIRATORY_TRACT | Status: DC
  Administered 2024-12-01: 3 mL via RESPIRATORY_TRACT
  Filled 2024-12-01: qty 3

## 2024-12-01 MED ORDER — BUDESONIDE 0.25 MG/2ML IN SUSP
0.2500 mg | Freq: Two times a day (BID) | RESPIRATORY_TRACT | Status: DC
  Administered 2024-12-01 – 2024-12-02 (×3): 0.25 mg via RESPIRATORY_TRACT
  Filled 2024-12-01 (×3): qty 2

## 2024-12-01 MED ORDER — OSELTAMIVIR PHOSPHATE 75 MG PO CAPS
75.0000 mg | ORAL_CAPSULE | Freq: Two times a day (BID) | ORAL | Status: AC
  Administered 2024-12-01 – 2024-12-03 (×6): 75 mg via ORAL
  Filled 2024-12-01 (×6): qty 1

## 2024-12-01 MED ORDER — ENOXAPARIN SODIUM 40 MG/0.4ML IJ SOSY
40.0000 mg | PREFILLED_SYRINGE | INTRAMUSCULAR | Status: DC
  Administered 2024-12-01: 40 mg via SUBCUTANEOUS
  Filled 2024-12-01: qty 0.4

## 2024-12-01 MED ORDER — PREDNISONE 50 MG PO TABS
60.0000 mg | ORAL_TABLET | Freq: Once | ORAL | Status: AC
  Administered 2024-12-01: 60 mg via ORAL
  Filled 2024-12-01: qty 1

## 2024-12-01 MED ORDER — METHYLPREDNISOLONE SODIUM SUCC 125 MG IJ SOLR
80.0000 mg | Freq: Every day | INTRAMUSCULAR | Status: DC
  Administered 2024-12-01 – 2024-12-02 (×2): 80 mg via INTRAVENOUS
  Filled 2024-12-01 (×2): qty 2

## 2024-12-01 MED ORDER — DM-GUAIFENESIN ER 30-600 MG PO TB12
1.0000 | ORAL_TABLET | Freq: Two times a day (BID) | ORAL | Status: AC | PRN
  Administered 2024-12-01 – 2024-12-02 (×3): 1 via ORAL
  Filled 2024-12-01 (×4): qty 1

## 2024-12-01 MED ORDER — ALBUTEROL SULFATE (2.5 MG/3ML) 0.083% IN NEBU: 2.5000 mg | INHALATION_SOLUTION | RESPIRATORY_TRACT | Status: AC | PRN

## 2024-12-01 MED ORDER — MELATONIN 5 MG PO TABS
5.0000 mg | ORAL_TABLET | Freq: Every evening | ORAL | Status: AC | PRN
  Administered 2024-12-01 – 2024-12-03 (×3): 5 mg via ORAL
  Filled 2024-12-01 (×3): qty 1

## 2024-12-01 MED ORDER — CARMEX CLASSIC LIP BALM EX OINT
1.0000 | TOPICAL_OINTMENT | CUTANEOUS | Status: AC | PRN
  Filled 2024-12-01 (×2): qty 10

## 2024-12-01 MED ORDER — ACETAMINOPHEN 325 MG PO TABS
650.0000 mg | ORAL_TABLET | Freq: Four times a day (QID) | ORAL | Status: AC | PRN
  Administered 2024-12-01 – 2024-12-02 (×3): 650 mg via ORAL
  Filled 2024-12-01 (×3): qty 2

## 2024-12-01 MED ORDER — IPRATROPIUM-ALBUTEROL 0.5-2.5 (3) MG/3ML IN SOLN
3.0000 mL | Freq: Four times a day (QID) | RESPIRATORY_TRACT | Status: AC
  Administered 2024-12-02 – 2024-12-03 (×6): 3 mL via RESPIRATORY_TRACT
  Filled 2024-12-01 (×6): qty 3

## 2024-12-01 MED ORDER — PREDNISONE 50 MG PO TABS
60.0000 mg | ORAL_TABLET | Freq: Every day | ORAL | Status: DC
  Filled 2024-12-01: qty 1

## 2024-12-01 MED ORDER — AMLODIPINE BESYLATE 5 MG PO TABS
5.0000 mg | ORAL_TABLET | Freq: Every day | ORAL | Status: AC
  Administered 2024-12-01 – 2024-12-03 (×3): 5 mg via ORAL
  Filled 2024-12-01 (×3): qty 1

## 2024-12-01 MED ORDER — ENOXAPARIN SODIUM 80 MG/0.8ML IJ SOSY
80.0000 mg | PREFILLED_SYRINGE | INTRAMUSCULAR | Status: AC
  Administered 2024-12-02 – 2024-12-03 (×2): 80 mg via SUBCUTANEOUS
  Filled 2024-12-01 (×2): qty 0.8

## 2024-12-01 NOTE — Progress Notes (Signed)
" °   12/01/24 2225  BiPAP/CPAP/SIPAP  BiPAP/CPAP/SIPAP Pt Type Adult  BiPAP/CPAP/SIPAP DREAMSTATIOND  Mask Type Full face mask  Dentures removed? Not applicable  Mask Size Medium  EPAP 8 cmH2O  Flow Rate 2 lpm (bled into circuit)  Patient Home Machine No  Patient Home Mask No  Patient Home Tubing No  Auto Titrate Yes  Minimum cmH2O 5 cmH2O  Maximum cmH2O 20 cmH2O  CPAP/SIPAP surface wiped down Yes  Device Plugged into RED Power Outlet Yes    "

## 2024-12-01 NOTE — Progress Notes (Signed)
 Pt placed on a CPAP at 8cm H2O with a 2L bleed in. Pt is tolerating well at this time.

## 2024-12-01 NOTE — Progress Notes (Signed)
 Patient admitted earlier this morning for shortness of breath due to influenza B.  Patient seen and examined at bedside and plan of care discussed. I have reviewed patient's medical records including this morning's H&P, current vitals, labs and medications myself.  Continue Tamiflu , steroids and nebs.  Wean off oxygen as able.

## 2024-12-01 NOTE — ED Notes (Signed)
 [facility] called for transport.

## 2024-12-01 NOTE — H&P (Signed)
 " History and Physical    Morgan Bryant FMW:[id number] DOB: Apr 09, 1981 DOA: 11/30/2024  PCP: Waddell Palma, PA-C  Patient coming from: [facility] ED  Chief Complaint: Shortness of breath  HPI: Morgan Bryant is a 44 y.o. female with medical history significant of chronic mild anemia, asthma, hypertension, super morbid obesity, OSA on CPAP, tobacco abuse presented to the ED with shortness of breath, cough, and wheezing.  Temperature 100.1 F and tachycardic.  Not hypoxic on arrival.  Labs showing no leukocytosis, hemoglobin 11.6 (stable), influenza B PCR positive.  Chest x-ray showing bronchial thickening, left lower lobe atelectasis/scarring.  EKG showing sinus tachycardia and no acute ischemic changes.  Patient was given multiple bronchodilator treatments including, Tamiflu , and prednisone  60 mg in the ED.  Patient is reporting 2-day history of shortness of breath, cough, and wheezing.  She uses albuterol  as needed but is not on a rescue inhaler for her asthma.  She is reporting some substernal/left-sided chest discomfort related to coughing.  Denies fevers, nausea, vomiting, diarrhea, or abdominal pain.  Review of Systems:  Review of Systems  All other systems reviewed and are negative.   Past Medical History:  Diagnosis Date   Anemia    Asthma    Hypertension    Morbid obesity (HCC)    Sleep apnea     Past Surgical History:  Procedure Laterality Date   CESAREAN SECTION       reports that she has been smoking cigarettes. She has never used smokeless tobacco. She reports current alcohol use. She reports that she does not use drugs.  Allergies[1]  Family History  Problem Relation Age of Onset   Hypertension Mother    Cancer Father     Prior to Admission medications  Medication Sig Start Date End Date Taking? Authorizing Provider  albuterol  (VENTOLIN  HFA) 108 (90 Base) MCG/ACT inhaler Inhale 2 puffs into the lungs every 4 (four) hours as needed for wheezing or shortness of breath.  09/14/23   Theadore Ozell HERO, MD  amLODipine  (NORVASC ) 5 MG tablet Take 5 mg by mouth daily.    [provider]  oxyCODONE -acetaminophen  (PERCOCET) 5-325 MG tablet Take 1 tablet by mouth every 6 (six) hours as needed for severe pain. 06/17/21   Molpus, John, MD  ferrous sulfate  325 (65 FE) MG tablet Take 1 tablet (325 mg total) by mouth 2 (two) times daily with a meal. Patient not taking: Reported on 08/07/2018 06/24/17 04/22/19  Krishnan, Gokul, MD  fluticasone  (FLOVENT  DISKUS) 50 MCG/BLIST diskus inhaler Inhale 2 puffs into the lungs 2 (two) times daily. Patient not taking: Reported on 08/07/2018 06/24/17 04/22/19  Verdene Purchase, MD    Physical Exam: Vitals:   12/01/24 0100 12/01/24 0158 12/01/24 0200 12/01/24 0306  BP:   (!) 157/102 (!) 144/82  Pulse: (!) 111 (!) 119 (!) 113 (!) 112  Resp: 20 15 20  (!) 24  Temp:    98.7 F (37.1 C)  TempSrc:    Oral  SpO2: 94% 100% (!) 87%   Weight:      Height:        Physical Exam Vitals reviewed.  Constitutional:      General: She is not in acute distress. HENT:     Head: Normocephalic and atraumatic.  Eyes:     Extraocular Movements: Extraocular movements intact.  Cardiovascular:     Rate and Rhythm: Regular rhythm. Tachycardia present.  Pulmonary:     Effort: Pulmonary effort is normal.     Breath sounds: No rales.  Comments: Faint wheezing Abdominal:     General: Bowel sounds are normal.     Palpations: Abdomen is soft.     Tenderness: There is no abdominal tenderness. There is no guarding.  Musculoskeletal:     Cervical back: Normal range of motion.     Right lower leg: No edema.     Left lower leg: No edema.  Skin:    General: Skin is warm and dry.  Neurological:     General: No focal deficit present.     Mental Status: She is alert and oriented to person, place, and time.     Labs on Admission: I have personally reviewed following labs and imaging studies  CBC: Recent Labs  Lab 11/30/24 2145  WBC 8.6  HGB  11.6*  HCT 36.8  MCV 75.6*  PLT 272   Basic Metabolic Panel: Recent Labs  Lab 11/30/24 2145  NA 138  K 3.7  CL 103  CO2 21*  GLUCOSE 99  BUN 9  CREATININE 0.76  CALCIUM 8.8*   GFR: Estimated Creatinine Clearance: 156.5 mL/min (by C-G formula based on SCr of 0.76 mg/dL). Liver Function Tests: No results for input(s): AST, ALT, ALKPHOS, BILITOT, PROT, ALBUMIN in the last 168 hours. No results for input(s): LIPASE, AMYLASE in the last 168 hours. No results for input(s): AMMONIA in the last 168 hours. Coagulation Profile: No results for input(s): INR, PROTIME in the last 168 hours. Cardiac Enzymes: No results for input(s): CKTOTAL, CKMB, CKMBINDEX, TROPONINI in the last 168 hours. BNP (last 3 results) No results for input(s): PROBNP in the last 8760 hours. HbA1C: No results for input(s): HGBA1C in the last 72 hours. CBG: No results for input(s): GLUCAP in the last 168 hours. Lipid Profile: No results for input(s): CHOL, HDL, LDLCALC, TRIG, CHOLHDL, LDLDIRECT in the last 72 hours. Thyroid Function Tests: No results for input(s): TSH, T4TOTAL, FREET4, T3FREE, THYROIDAB in the last 72 hours. Anemia Panel: No results for input(s): VITAMINB12, FOLATE, FERRITIN, TIBC, IRON, RETICCTPCT in the last 72 hours. Urine analysis:    Component Value Date/Time   COLORURINE YELLOW 11/12/2020 0209   APPEARANCEUR CLEAR 11/12/2020 0209   LABSPEC 1.025 11/12/2020 0209   PHURINE 5.5 11/12/2020 0209   GLUCOSEU NEGATIVE 11/12/2020 0209   HGBUR NEGATIVE 11/12/2020 0209   BILIRUBINUR NEGATIVE 11/12/2020 0209   KETONESUR NEGATIVE 11/12/2020 0209   PROTEINUR NEGATIVE 11/12/2020 0209   NITRITE NEGATIVE 11/12/2020 0209   LEUKOCYTESUR NEGATIVE 11/12/2020 0209    Radiological Exams on Admission: DG Chest 2 View Result Date: 11/30/2024 CLINICAL DATA:  Shortness of breath.  Cough. EXAM: CHEST - 2 VIEW COMPARISON:  11/16/2023  FINDINGS: The cardiomediastinal contours are normal. Bronchial thickening. Left lower lobe atelectasis or scarring. No confluent opacity. Pulmonary vasculature is normal. No pleural effusion or pneumothorax. No acute osseous abnormalities are seen. Soft tissue attenuation from habitus limits detailed assessment. IMPRESSION: Bronchial thickening. Left lower lobe atelectasis or scarring. Electronically Signed   By: Andrea Gasman M.D.   On: 11/30/2024 21:49    Assessment and Plan  Influenza B infection Acute asthma exacerbation SIRS Borderline febrile, tachycardic.  Desaturated to 80s when asleep and was placed on 2 L [place] with improvement of oxygen. Influenza B PCR positive.  Chest x-ray showing bronchial thickening, left lower lobe atelectasis/scarring.  Patient received multiple bronchodilator treatments, Tamiflu , and prednisone  60 mg in the ED.  No respiratory distress at this time, faint wheezing on auscultation of the lungs.  Although informed by RN that patient  did become significantly dyspneic earlier when transferring from EMS stretcher to the hospital bed.  PE is on the differential given super morbid obesity, check stat D-dimer.  Continue 5-day course of prednisone  and Tamiflu .  Scheduled DuoNeb every 8 hours, Pulmicort  neb twice daily, and albuterol  neb every 4 hours PRN.  Mucinex  DM PRN cough.  Continuous pulse ox.  Continue supplemental oxygen, wean as tolerated.  Chest pain Possibly related to coughing.  EKG without acute ischemic changes.  Check troponin and D-dimer.  OSA CPAP at night.  Chronic mild anemia Hemoglobin stable.  Hypertension Continue amlodipine .  DVT prophylaxis: Lovenox  Code Status: Full Code (discussed with patient) Level of care: Telemetry bed Admission status: It is my clinical opinion that referral for OBSERVATION is reasonable and necessary in this patient based on the above information provided. The aforementioned taken together are felt to place the  patient at high risk for further clinical deterioration. However, it is anticipated that the patient may be medically stable for discharge from the hospital within 24 to 48 hours.  Editha Ram MD [facility]  If 7PM-7AM, please contact night-coverage [email or web address]  12/01/2024, 3:34 AM       [1]  Allergies Allergen Reactions   Penicillins Hives    Has patient had a PCN reaction causing immediate rash, facial/tongue/throat swelling, SOB or lightheadedness with hypotension: Yes Has patient had a PCN reaction causing severe rash involving mucus membranes or skin necrosis: No Has patient had a PCN reaction that required hospitalization: Yes Has patient had a PCN reaction occurring within the last 10 years: No If all of the above answers are NO, then may proceed with Cephalosporin use.    "

## 2024-12-02 MED ORDER — BUDESONIDE 0.5 MG/2ML IN SUSP
0.5000 mg | Freq: Two times a day (BID) | RESPIRATORY_TRACT | Status: AC
  Administered 2024-12-02 – 2024-12-03 (×3): 0.5 mg via RESPIRATORY_TRACT
  Filled 2024-12-02 (×3): qty 2

## 2024-12-02 MED ORDER — IBUPROFEN 200 MG PO TABS
400.0000 mg | ORAL_TABLET | Freq: Four times a day (QID) | ORAL | Status: DC | PRN
  Administered 2024-12-02 – 2024-12-03 (×4): 400 mg via ORAL
  Filled 2024-12-02 (×4): qty 2

## 2024-12-02 MED ORDER — IBUPROFEN 200 MG PO TABS
600.0000 mg | ORAL_TABLET | Freq: Once | ORAL | Status: AC
  Administered 2024-12-02: 600 mg via ORAL
  Filled 2024-12-02: qty 3

## 2024-12-02 MED ORDER — METHYLPREDNISOLONE SODIUM SUCC 125 MG IJ SOLR
80.0000 mg | Freq: Two times a day (BID) | INTRAMUSCULAR | Status: AC
  Administered 2024-12-02 – 2024-12-03 (×3): 80 mg via INTRAVENOUS
  Filled 2024-12-02 (×3): qty 2

## 2024-12-02 NOTE — Progress Notes (Signed)
 " PROGRESS NOTE    Morgan Bryant  FMW:[id number] DOB: 29-Nov-1980 DOA: 11/30/2024 PCP: Waddell Palma, PA-C   Brief Narrative:  44 y.o. female with medical history significant of chronic mild anemia, asthma, hypertension, morbid obesity, OSA on CPAP, tobacco abuse presented with worsening presented with.  On presentation, her temp was 100.1, she was tachycardic.  Chest x-ray showed bronchial thickening, left lower lobe atelectasis/scarring.  She tested positive for influenza B.  She was started on Tamiflu , breathing treatments and steroids.  Assessment & Plan:   Influenza B infection Acute asthma exacerbation Acute respiratory failure with hypoxia -Still requiring 2 L oxygen by nasal cannula.  Wean off as able.  Does not feel well yet.  Increase Solu-Medrol  to 80 mg IV every 12 hours.  Continue current nebs and Tamiflu .  Chest pain -Possibly related to coughing.  EKG without acute ischemic changes.  High sensitive troponin was less than 6.  No further workup needed.    OSA - Continue CPAP at night  Obesity class III - Outpatient follow-up  Hypertension - Monitor blood pressure.  Continue amlodipine   Chronic mild anemia/microcytic anemia - Questionable cause.  Hemoglobin stable.  Monitor intermittently   DVT prophylaxis: Lovenox  Code Status: Full Family Communication: None at bedside Disposition Plan: Status is: Observation The patient will require care spanning > 2 midnights and should be moved to inpatient because: Of severity of illness    Consultants: None  Procedures: None  Antimicrobials: None   Subjective: Patient seen and examined at bedside.  Pain slightly better but still short of breath with minimal exertion.  Does not feel ready to go home today.  No fever or vomiting reported.  Objective: Vitals:   12/01/24 1805 12/01/24 2203 12/02/24 0216 12/02/24 0918  BP: (!) 171/91 (!) 151/71 135/85   Pulse: (!) 102 93 75   Resp:  18 18   Temp: 98.3 F (36.8 C)  98.4 F (36.9 C)    TempSrc:      SpO2: 96% 94% 98% 95%  Weight:      Height:        Intake/Output Summary (Last 24 hours) at 12/02/2024 1005 Last data filed at 12/01/2024 1800 Gross per 24 hour  Intake 240 ml  Output 600 ml  Net -360 ml   Filed Weights   11/30/24 2050  Weight: (!) 167.8 kg    Examination:  General exam: Appears calm and comfortable  Respiratory system: Bilateral decreased breath sounds at bases with scattered wheezing Cardiovascular system: S1 & S2 heard, intermittently tachycardic Gastrointestinal system: Abdomen is morbidly obese, nondistended, soft and nontender. Normal bowel sounds heard. Extremities: No cyanosis, clubbing, edema  Central nervous system: Alert and oriented. No focal neurological deficits. Moving extremities Skin: No rashes, lesions or ulcers Psychiatry: Judgement and insight appear normal. Mood & affect appropriate.     Data Reviewed: I have personally reviewed following labs and imaging studies  CBC: Recent Labs  Lab 11/30/24 2145  WBC 8.6  HGB 11.6*  HCT 36.8  MCV 75.6*  PLT 272   Basic Metabolic Panel: Recent Labs  Lab 11/30/24 2145  NA 138  K 3.7  CL 103  CO2 21*  GLUCOSE 99  BUN 9  CREATININE 0.76  CALCIUM 8.8*   GFR: Estimated Creatinine Clearance: 156.5 mL/min (by C-G formula based on SCr of 0.76 mg/dL). Liver Function Tests: No results for input(s): AST, ALT, ALKPHOS, BILITOT, PROT, ALBUMIN in the last 168 hours. No results for input(s): LIPASE, AMYLASE in the last  168 hours. No results for input(s): AMMONIA in the last 168 hours. Coagulation Profile: No results for input(s): INR, PROTIME in the last 168 hours. Cardiac Enzymes: No results for input(s): CKTOTAL, CKMB, CKMBINDEX, TROPONINI in the last 168 hours. BNP (last 3 results) No results for input(s): PROBNP in the last 8760 hours. HbA1C: No results for input(s): HGBA1C in the last 72 hours. CBG: No results for  input(s): GLUCAP in the last 168 hours. Lipid Profile: No results for input(s): CHOL, HDL, LDLCALC, TRIG, CHOLHDL, LDLDIRECT in the last 72 hours. Thyroid Function Tests: No results for input(s): TSH, T4TOTAL, FREET4, T3FREE, THYROIDAB in the last 72 hours. Anemia Panel: No results for input(s): VITAMINB12, FOLATE, FERRITIN, TIBC, IRON, RETICCTPCT in the last 72 hours. Sepsis Labs: No results for input(s): PROCALCITON, LATICACIDVEN in the last 168 hours.  Recent Results (from the past 240 hours)  Resp panel by RT-PCR (RSV, Flu A&B, Covid) Anterior Nasal Swab     Status: Abnormal   Collection Time: 11/30/24  9:08 PM   Specimen: Anterior Nasal Swab  Result Value Ref Range Status   SARS Coronavirus 2 by RT PCR NEGATIVE NEGATIVE Final    Comment: (NOTE) SARS-CoV-2 target nucleic acids are NOT DETECTED.  The SARS-CoV-2 RNA is generally detectable in upper respiratory specimens during the acute phase of infection. The lowest concentration of SARS-CoV-2 viral copies this assay can detect is 138 copies/mL. A negative result does not preclude SARS-Cov-2 infection and should not be used as the sole basis for treatment or other patient management decisions. A negative result may occur with  improper specimen collection/handling, submission of specimen other than nasopharyngeal swab, presence of viral mutation(s) within the areas targeted by this assay, and inadequate number of viral copies(<138 copies/mL). A negative result must be combined with clinical observations, patient history, and epidemiological information. The expected result is Negative.  Fact Sheet for Patients:  bloggercourse.com  Fact Sheet for Healthcare Providers:  seriousbroker.it  This test is no t yet approved or cleared by the [place]  FDA and  has been authorized for detection and/or diagnosis of SARS-CoV-2 by FDA under an  Emergency Use Authorization (EUA). This EUA will remain  in effect (meaning this test can be used) for the duration of the COVID-19 declaration under Section 564(b)(1) of the Act, 21 U.S.C.section 360bbb-3(b)(1), unless the authorization is terminated  or revoked sooner.       Influenza A by PCR NEGATIVE NEGATIVE Final   Influenza B by PCR POSITIVE (A) NEGATIVE Final    Comment: (NOTE) The Xpert Xpress SARS-CoV-2/FLU/RSV plus assay is intended as an aid in the diagnosis of influenza from Nasopharyngeal swab specimens and should not be used as a sole basis for treatment. Nasal washings and aspirates are unacceptable for Xpert Xpress SARS-CoV-2/FLU/RSV testing.  Fact Sheet for Patients: bloggercourse.com  Fact Sheet for Healthcare Providers: seriousbroker.it  This test is not yet approved or cleared by the [place]  FDA and has been authorized for detection and/or diagnosis of SARS-CoV-2 by FDA under an Emergency Use Authorization (EUA). This EUA will remain in effect (meaning this test can be used) for the duration of the COVID-19 declaration under Section 564(b)(1) of the Act, 21 U.S.C. section 360bbb-3(b)(1), unless the authorization is terminated or revoked.     Resp Syncytial Virus by PCR NEGATIVE NEGATIVE Final    Comment: (NOTE) Fact Sheet for Patients: bloggercourse.com  Fact Sheet for Healthcare Providers: seriousbroker.it  This test is not yet approved or cleared by the [place]   FDA and has been authorized for detection and/or diagnosis of SARS-CoV-2 by FDA under an Emergency Use Authorization (EUA). This EUA will remain in effect (meaning this test can be used) for the duration of the COVID-19 declaration under Section 564(b)(1) of the Act, 21 U.S.C. section 360bbb-3(b)(1), unless the authorization is terminated or revoked.  Performed at [facility], [address] [place], [place] [address]          Radiology Studies: DG Chest 2 View Result Date: 11/30/2024 CLINICAL DATA:  Shortness of breath.  Cough. EXAM: CHEST - 2 VIEW COMPARISON:  11/16/2023 FINDINGS: The cardiomediastinal contours are normal. Bronchial thickening. Left lower lobe atelectasis or scarring. No confluent opacity. Pulmonary vasculature is normal. No pleural effusion or pneumothorax. No acute osseous abnormalities are seen. Soft tissue attenuation from habitus limits detailed assessment. IMPRESSION: Bronchial thickening. Left lower lobe atelectasis or scarring. Electronically Signed   By: Andrea Gasman M.D.   On: 11/30/2024 21:49        Scheduled Meds:  amLODipine   5 mg Oral Daily   budesonide  (PULMICORT ) nebulizer solution  0.25 mg Nebulization BID   enoxaparin  (LOVENOX ) injection  80 mg Subcutaneous Q24H   ipratropium-albuterol   3 mL Nebulization QID   methylPREDNISolone  (SOLU-MEDROL ) injection  80 mg Intravenous Daily   oseltamivir   75 mg Oral BID   Continuous Infusions:        Sophie Mao, MD [facility] 12/02/2024, 10:05 AM   "

## 2024-12-02 NOTE — Plan of Care (Signed)

## 2024-12-02 NOTE — TOC Initial Note (Signed)
 Transition of Care [facility]) - Initial/Assessment Note    Patient Details  Name: Morgan Bryant MRN: [id number] Date of Birth: 1981/06/14  Transition of Care [facility]) CM/SW Contact:    Sonda Manuella Quill, RN Phone Number: 12/02/2024, 3:05 PM  Clinical Narrative:                 Morgan Bryant w/ pt in room; pt said she lives at home w/ her 3 teenagers; she plans to return w/ family support at d/c; pt identified POC mother Morgan Bryant ([phone]); her mother will provide transportation; insurance/PCP verified; she denied SDOH risks; she has nebulizer; pt expressed concern that her device will intermittently stop working; she would like another device; she does not have agency preference; she does not have other DME, HH services, or home oxygen; Dr Cheryle notified; orders placed; referral given to Jermaine at [facility]; he said agency can accept referral and DME will be delivered to pt's room before d/c; agency contact info placed in follow up provider section of d/c instructions; IP CM will follow.  Expected Discharge Plan: Home/Self Care     Patient Goals and CMS Choice Patient states their goals for this hospitalization and ongoing recovery are:: home          Expected Discharge Plan and Services   Discharge Planning Services: CM Consult   Living arrangements for the past 2 months: Single Family Home                 DME Arranged: N/A DME Agency: NA       HH Arranged: NA HH Agency: NA        Prior Living Arrangements/Services Living arrangements for the past 2 months: Single Family Home Lives with:: Minor Children Patient language and need for interpreter reviewed:: Yes Do you feel safe going back to the place where you live?: Yes      Need for Family Participation in Patient Care: Yes (Comment) Care giver support system in place?: Yes (comment) Current home services: DME (nebulizer) Criminal Activity/Legal Involvement Pertinent to Current Situation/Hospitalization: No - Comment as  needed  Activities of Daily Living   ADL Screening (condition at time of admission) Independently performs ADLs?: Yes (appropriate for developmental age)  Permission Sought/Granted Permission sought to share information with : Case Manager Permission granted to share information with : Yes, Verbal Permission Granted  Share Information with NAME: Case Manager     Permission granted to share info w Relationship: Morgan Bryant (mother) [phone]     Emotional Assessment Appearance:: Appears stated age Attitude/Demeanor/Rapport: Gracious Affect (typically observed): Accepting Orientation: : Oriented to Self, Oriented to Place, Oriented to  Time, Oriented to Situation Alcohol / Substance Use: Not Applicable Psych Involvement: No (comment)  Admission diagnosis:  Flu [J11.1] SOB (shortness of breath) [R06.02] Influenza B [J10.1] Patient Active Problem List   Diagnosis Date Noted   SIRS (systemic inflammatory response syndrome) (HCC) 12/01/2024   Chest pain 12/01/2024   Influenza B 11/30/2024   Diverticulitis 08/07/2018   Asthma exacerbation 06/20/2017   OSA (obstructive sleep apnea) 06/20/2017   Essential hypertension 06/20/2017   Morbid obesity (HCC) 06/20/2017   Acute asthma exacerbation 06/20/2017   PCP:  Waddell Palma, PA-C Pharmacy:   [id number] - [place], [place] - [address] [place] [address] Phone: [phone] Fax: [phone]  [id number] - [place], [place] - [address] [place]  [address] Phone: [phone] Fax: [phone]  [facility] [phone] - [place] - [address] AT [facility] [address] [place] [address] Phone: [phone] Fax: [phone]     Social Drivers of Health (SDOH) Social History: SDOH Screenings   Food Insecurity: No Food Insecurity (12/02/2024)  Recent Concern: Food  Insecurity - Food Insecurity Present (12/01/2024)  Housing: Low Risk (12/02/2024)  Transportation Needs: No Transportation Needs (12/02/2024)  Utilities: Not At Risk (12/02/2024)  Social Connections: Unknown (03/05/2022)   Received from [facility]  Tobacco Use: High Risk (11/30/2024)   SDOH Interventions: Food Insecurity Interventions: Intervention Not Indicated, Inpatient TOC Housing Interventions: Intervention Not Indicated, Inpatient TOC Transportation Interventions: Intervention Not Indicated, Inpatient TOC Utilities Interventions: Intervention Not Indicated, Inpatient TOC   Readmission Risk Interventions     No data to display

## 2024-12-02 NOTE — Progress Notes (Signed)
" °   12/02/24 2259  BiPAP/CPAP/SIPAP  BiPAP/CPAP/SIPAP Pt Type Adult  BiPAP/CPAP/SIPAP DREAMSTATIOND  Mask Type Full face mask  Dentures removed? Not applicable  Mask Size Medium  EPAP 8 cmH2O  Flow Rate 2 lpm  Patient Home Machine No  Patient Home Mask No  Patient Home Tubing No  Auto Titrate Yes  Minimum cmH2O 5 cmH2O  Maximum cmH2O 20 cmH2O  CPAP/SIPAP surface wiped down Yes  Device Plugged into RED Power Outlet Yes    "

## 2024-12-02 NOTE — Plan of Care (Signed)
   Problem: Clinical Measurements: Goal: Ability to maintain clinical measurements within normal limits will improve Outcome: Progressing   Problem: Activity: Goal: Risk for activity intolerance will decrease Outcome: Progressing   Problem: Nutrition: Goal: Adequate nutrition will be maintained Outcome: Progressing

## 2024-12-03 MED ORDER — IBUPROFEN 800 MG PO TABS
800.0000 mg | ORAL_TABLET | Freq: Four times a day (QID) | ORAL | Status: AC | PRN
  Administered 2024-12-03: 800 mg via ORAL
  Filled 2024-12-03: qty 1

## 2024-12-03 NOTE — Plan of Care (Signed)

## 2024-12-03 NOTE — Progress Notes (Signed)
 " PROGRESS NOTE    Morgan Bryant  FMW:[id number] DOB: [birth year] DOA: 11/30/2024 PCP: Waddell Palma, PA-C   Brief Narrative:  44 y.o. female with medical history significant of chronic mild anemia, asthma, hypertension, morbid obesity, OSA on CPAP, tobacco abuse presented with worsening presented with.  On presentation, her temp was 100.1, she was tachycardic.  Chest x-ray showed bronchial thickening, left lower lobe atelectasis/scarring.  She tested positive for influenza B.  She was started on Tamiflu , breathing treatments and steroids.  Assessment & Plan:   Influenza B infection Acute asthma exacerbation Acute respiratory failure with hypoxia -Does not feel well yet.  Continue Solu-Medrol  80 mg IV every 12 hours.  Continue current nebs and Tamiflu .  Chest pain -Possibly related to coughing.  EKG without acute ischemic changes.  High sensitive troponin was less than 6.  No further workup needed.    OSA - Continue CPAP at night  Obesity class III - Outpatient follow-up  Hypertension - Monitor blood pressure.  Continue amlodipine   Chronic mild anemia/microcytic anemia - Questionable cause.  Hemoglobin stable.  Monitor intermittently   DVT prophylaxis: Lovenox  Code Status: Full Family Communication: None at bedside Disposition Plan: Status is:  inpatient because: Of severity of illness    Consultants: None  Procedures: None  Antimicrobials: None   Subjective: Patient seen and examined at bedside.  Continues to have intermittent cough and shortness of breath.  Does not feel ready to go home yet today.  No vomiting, worsening abdominal reported Objective: Vitals:   12/02/24 1948 12/02/24 2003 12/02/24 2006 12/03/24 0512  BP: (!) 152/90   (!) 142/78  Pulse: 79   70  Resp: 19   19  Temp: 98.1 F (36.7 C)   98 F (36.7 C)  TempSrc: Oral   Axillary  SpO2: 99% 98% 98% 96%  Weight:      Height:       No intake or output data in the 24 hours ending 12/03/24  1047  Filed Weights   11/30/24 2050  Weight: (!) 167.8 kg    Examination:  General exam: No acute distress.   Respiratory system: Decreased breath sounds at bases bilaterally with some wheezing  cardiovascular system: Rate mostly controlled; S1 and S2 are heard Gastrointestinal system: Abdomen is morbidly obese, distended slightly; soft and nontender.  Bowel sounds are heard. Extremities: Trace lower extremity edema; no clubbing   Data Reviewed: I have personally reviewed following labs and imaging studies  CBC: Recent Labs  Lab 11/30/24 2145  WBC 8.6  HGB 11.6*  HCT 36.8  MCV 75.6*  PLT 272   Basic Metabolic Panel: Recent Labs  Lab 11/30/24 2145  NA 138  K 3.7  CL 103  CO2 21*  GLUCOSE 99  BUN 9  CREATININE 0.76  CALCIUM 8.8*   GFR: Estimated Creatinine Clearance: 156.5 mL/min (by C-G formula based on SCr of 0.76 mg/dL). Liver Function Tests: No results for input(s): AST, ALT, ALKPHOS, BILITOT, PROT, ALBUMIN in the last 168 hours. No results for input(s): LIPASE, AMYLASE in the last 168 hours. No results for input(s): AMMONIA in the last 168 hours. Coagulation Profile: No results for input(s): INR, PROTIME in the last 168 hours. Cardiac Enzymes: No results for input(s): CKTOTAL, CKMB, CKMBINDEX, TROPONINI in the last 168 hours. BNP (last 3 results) No results for input(s): PROBNP in the last 8760 hours. HbA1C: No results for input(s): HGBA1C in the last 72 hours. CBG: No results for input(s): GLUCAP in the last 168 hours.  Lipid Profile: No results for input(s): CHOL, HDL, LDLCALC, TRIG, CHOLHDL, LDLDIRECT in the last 72 hours. Thyroid Function Tests: No results for input(s): TSH, T4TOTAL, FREET4, T3FREE, THYROIDAB in the last 72 hours. Anemia Panel: No results for input(s): VITAMINB12, FOLATE, FERRITIN, TIBC, IRON, RETICCTPCT in the last 72 hours. Sepsis Labs: No results for  input(s): PROCALCITON, LATICACIDVEN in the last 168 hours.  Recent Results (from the past 240 hours)  Resp panel by RT-PCR (RSV, Flu A&B, Covid) Anterior Nasal Swab     Status: Abnormal   Collection Time: 11/30/24  9:08 PM   Specimen: Anterior Nasal Swab  Result Value Ref Range Status   SARS Coronavirus 2 by RT PCR NEGATIVE NEGATIVE Final    Comment: (NOTE) SARS-CoV-2 target nucleic acids are NOT DETECTED.  The SARS-CoV-2 RNA is generally detectable in upper respiratory specimens during the acute phase of infection. The lowest concentration of SARS-CoV-2 viral copies this assay can detect is 138 copies/mL. A negative result does not preclude SARS-Cov-2 infection and should not be used as the sole basis for treatment or other patient management decisions. A negative result may occur with  improper specimen collection/handling, submission of specimen other than nasopharyngeal swab, presence of viral mutation(s) within the areas targeted by this assay, and inadequate number of viral copies(<138 copies/mL). A negative result must be combined with clinical observations, patient history, and epidemiological information. The expected result is Negative.  Fact Sheet for Patients:  bloggercourse.com  Fact Sheet for Healthcare Providers:  seriousbroker.it  This test is no t yet approved or cleared by the [place]  FDA and  has been authorized for detection and/or diagnosis of SARS-CoV-2 by FDA under an Emergency Use Authorization (EUA). This EUA will remain  in effect (meaning this test can be used) for the duration of the COVID-19 declaration under Section 564(b)(1) of the Act, 21 U.S.C.section 360bbb-3(b)(1), unless the authorization is terminated  or revoked sooner.       Influenza A by PCR NEGATIVE NEGATIVE Final   Influenza B by PCR POSITIVE (A) NEGATIVE Final    Comment: (NOTE) The Xpert Xpress SARS-CoV-2/FLU/RSV plus  assay is intended as an aid in the diagnosis of influenza from Nasopharyngeal swab specimens and should not be used as a sole basis for treatment. Nasal washings and aspirates are unacceptable for Xpert Xpress SARS-CoV-2/FLU/RSV testing.  Fact Sheet for Patients: bloggercourse.com  Fact Sheet for Healthcare Providers: seriousbroker.it  This test is not yet approved or cleared by the [place]  FDA and has been authorized for detection and/or diagnosis of SARS-CoV-2 by FDA under an Emergency Use Authorization (EUA). This EUA will remain in effect (meaning this test can be used) for the duration of the COVID-19 declaration under Section 564(b)(1) of the Act, 21 U.S.C. section 360bbb-3(b)(1), unless the authorization is terminated or revoked.     Resp Syncytial Virus by PCR NEGATIVE NEGATIVE Final    Comment: (NOTE) Fact Sheet for Patients: bloggercourse.com  Fact Sheet for Healthcare Providers: seriousbroker.it  This test is not yet approved or cleared by the [place]  FDA and has been authorized for detection and/or diagnosis of SARS-CoV-2 by FDA under an Emergency Use Authorization (EUA). This EUA will remain in effect (meaning this test can be used) for the duration of the COVID-19 declaration under Section 564(b)(1) of the Act, 21 U.S.C. section 360bbb-3(b)(1), unless the authorization is terminated or revoked.  Performed at [facility], [address], [place], [place] [address]  Radiology Studies: No results found.       Scheduled Meds:  amLODipine   5 mg Oral Daily   budesonide  (PULMICORT ) nebulizer solution  0.5 mg Nebulization BID   enoxaparin  (LOVENOX ) injection  80 mg Subcutaneous Q24H   ipratropium-albuterol   3 mL Nebulization QID   methylPREDNISolone  (SOLU-MEDROL ) injection  80 mg Intravenous Q12H   oseltamivir   75 mg Oral  BID   Continuous Infusions:        Sophie Mao, MD [facility] 12/03/2024, 10:47 AM   "

## 2024-12-03 NOTE — Plan of Care (Signed)
